# Patient Record
Sex: Male | Born: 1950
Health system: Southern US, Community
[De-identification: ages and names within clinical notes are randomized; demographics above are authoritative.]

## PROBLEM LIST (undated history)

## (undated) DIAGNOSIS — E785 Hyperlipidemia, unspecified: Secondary | ICD-10-CM

## (undated) DIAGNOSIS — T7840XA Allergy, unspecified, initial encounter: Secondary | ICD-10-CM

## (undated) DIAGNOSIS — Z8719 Personal history of other diseases of the digestive system: Secondary | ICD-10-CM

## (undated) DIAGNOSIS — N400 Enlarged prostate without lower urinary tract symptoms: Secondary | ICD-10-CM

## (undated) DIAGNOSIS — Z5189 Encounter for other specified aftercare: Secondary | ICD-10-CM

## (undated) HISTORY — PX: TONSILLECTOMY: SUR1361

## (undated) HISTORY — DX: Encounter for other specified aftercare: Z51.89

## (undated) HISTORY — PX: COLONOSCOPY: SHX174

## (undated) HISTORY — DX: Hyperlipidemia, unspecified: E78.5

## (undated) HISTORY — DX: Personal history of other diseases of the digestive system: Z87.19

## (undated) HISTORY — DX: Allergy, unspecified, initial encounter: T78.40XA

## (undated) HISTORY — DX: Benign prostatic hyperplasia without lower urinary tract symptoms: N40.0

---

## 2007-04-13 ENCOUNTER — Ambulatory Visit: Payer: Self-pay | Admitting: Internal Medicine

## 2007-04-21 ENCOUNTER — Ambulatory Visit: Payer: Self-pay | Admitting: Internal Medicine

## 2013-03-25 ENCOUNTER — Encounter: Payer: Self-pay | Admitting: Family Medicine

## 2013-03-25 ENCOUNTER — Ambulatory Visit (INDEPENDENT_AMBULATORY_CARE_PROVIDER_SITE_OTHER): Payer: BC Managed Care – PPO | Admitting: Family Medicine

## 2013-03-25 ENCOUNTER — Encounter (INDEPENDENT_AMBULATORY_CARE_PROVIDER_SITE_OTHER): Payer: Self-pay

## 2013-03-25 ENCOUNTER — Ambulatory Visit (INDEPENDENT_AMBULATORY_CARE_PROVIDER_SITE_OTHER): Payer: BC Managed Care – PPO

## 2013-03-25 VITALS — BP 116/77 | HR 61 | Temp 98.5°F | Ht 70.0 in | Wt 166.0 lb

## 2013-03-25 DIAGNOSIS — E559 Vitamin D deficiency, unspecified: Secondary | ICD-10-CM

## 2013-03-25 DIAGNOSIS — Z23 Encounter for immunization: Secondary | ICD-10-CM

## 2013-03-25 DIAGNOSIS — N529 Male erectile dysfunction, unspecified: Secondary | ICD-10-CM | POA: Insufficient documentation

## 2013-03-25 DIAGNOSIS — Z8042 Family history of malignant neoplasm of prostate: Secondary | ICD-10-CM | POA: Insufficient documentation

## 2013-03-25 DIAGNOSIS — K644 Residual hemorrhoidal skin tags: Secondary | ICD-10-CM | POA: Insufficient documentation

## 2013-03-25 DIAGNOSIS — K52839 Microscopic colitis, unspecified: Secondary | ICD-10-CM | POA: Insufficient documentation

## 2013-03-25 DIAGNOSIS — E785 Hyperlipidemia, unspecified: Secondary | ICD-10-CM | POA: Insufficient documentation

## 2013-03-25 DIAGNOSIS — Z0000 Encounter for general adult medical examination without abnormal findings: Secondary | ICD-10-CM

## 2013-03-25 DIAGNOSIS — N400 Enlarged prostate without lower urinary tract symptoms: Secondary | ICD-10-CM

## 2013-03-25 DIAGNOSIS — R000 Tachycardia, unspecified: Secondary | ICD-10-CM

## 2013-03-25 NOTE — Progress Notes (Signed)
Subjective:    Patient ID: Matthew Vasquez, male    DOB: 01-Jan-1951, 62 y.o.   MRN: [id number]  HPI Patient here today for annual physical exam. From reviewing the patient's record, he has a past history of hyperlipidemia, hemorrhoids, loose bowel movements, and rectal bleeding. He is last stress test was done in 2000 and this was normal.    There are no active problems to display for this patient.  Outpatient Encounter Prescriptions as of 03/25/2013  Medication Sig  . cholecalciferol (VITAMIN D) 1000 UNITS tablet Take 1,000 Units by mouth daily.    Review of Systems  Constitutional: Negative.   HENT: Negative.   Eyes: Negative.   Respiratory: Positive for cough (minor "tickle cough").   Cardiovascular: Negative.   Gastrointestinal: Negative.   Endocrine: Negative.   Genitourinary: Negative.   Musculoskeletal: Negative.   Skin: Negative.   Allergic/Immunologic: Negative.   Neurological: Negative.   Hematological: Negative.   Psychiatric/Behavioral: Negative.    Patient does complain of some erectile dysfunction and slightly slower voiding than usual.    Objective:   Physical Exam  Nursing note and vitals reviewed. Constitutional: He is oriented to person, place, and time. He appears well-developed and well-nourished.  HENT:  Head: Normocephalic and atraumatic.  Right Ear: External ear normal.  Left Ear: External ear normal.  Nose: Nose normal.  Mouth/Throat: Oropharynx is clear and moist. No oropharyngeal exudate.  Eyes: Conjunctivae and EOM are normal. Pupils are equal, round, and reactive to light. Right eye exhibits no discharge. Left eye exhibits no discharge. No scleral icterus.  Neck: Normal range of motion. Neck supple. No tracheal deviation present. No thyromegaly present.  Cardiovascular: Normal rate, regular rhythm, normal heart sounds and intact distal pulses.  Exam reveals no gallop and no friction rub.   No murmur heard. At 60 per minute  Pulmonary/Chest:  Effort normal and breath sounds normal. No respiratory distress. He has no wheezes. He has no rales. He exhibits no tenderness.  Abdominal: Soft. Bowel sounds are normal. He exhibits no mass. There is no tenderness. There is no rebound and no guarding.  Genitourinary: Rectum normal and penis normal. No penile tenderness.  The prostate was slightly enlarged but smooth. There were no rectal masses. There were abundant external hemorrhoids. The testicles were normal and there was no inguinal hernia. The last PSA in November of 2011 was 1.1  Musculoskeletal: Normal range of motion. He exhibits no edema and no tenderness.  Lymphadenopathy:    He has no cervical adenopathy.  Neurological: He is alert and oriented to person, place, and time. He has normal reflexes. No cranial nerve deficit.  Skin: Skin is warm and dry. No rash noted. No erythema. No pallor.  Psychiatric: He has a normal mood and affect. His behavior is normal. Judgment and thought content normal.   BP 116/77  Pulse 61  Temp(Src) 98.5 F (36.9 C) (Oral)  Ht 5\' 10"  (1.778 m)  Wt 166 lb (75.297 kg)  BMI 23.82 kg/m2  [facility] reading (PRIMARY) by  Dr. Christell Constant; chest x-ray--within normal limits                                EKG: {ekg findings: Bradycardia.       Assessment & Plan:   1. Annual physical exam   2. Vitamin D deficiency   3. BPH (benign prostatic hyperplasia)   4. Hyperlipidemia   5. External hemorrhoids  6. Erectile dysfunction    Orders Placed This Encounter  Procedures  . DG Chest 2 View    Standing Status: Future     Number of Occurrences: 1     Standing Expiration Date: 05/25/2014    Order Specific Question:  Reason for Exam (SYMPTOM  OR DIAGNOSIS REQUIRED)    Answer:  annual exam    Order Specific Question:  Preferred imaging location?    Answer:  Internal  . PSA, total and free    Standing Status: Future     Number of Occurrences:      Standing Expiration Date: 03/25/2014  . NMR, lipoprofile     Standing Status: Future     Number of Occurrences:      Standing Expiration Date: 03/25/2014  . BMP8+EGFR    Standing Status: Future     Number of Occurrences:      Standing Expiration Date: 03/25/2014  . Hepatic function panel    Standing Status: Future     Number of Occurrences:      Standing Expiration Date: 03/25/2014  . Vit D  25 hydroxy (rtn osteoporosis monitoring)    Standing Status: Future     Number of Occurrences:      Standing Expiration Date: 03/25/2014  . POCT CBC    Standing Status: Future     Number of Occurrences:      Standing Expiration Date: 04/24/2013  . POCT UA - Microscopic Only  . POCT urinalysis dipstick  . EKG 12-Lead   Meds ordered this encounter  Medications  . cholecalciferol (VITAMIN D) 1000 UNITS tablet    Sig: Take 1,000 Units by mouth daily.   Patient Instructions  Continue current medications. Continue good therapeutic lifestyle changes which include good diet and exercise. Fall precautions discussed with patient. Schedule your flu vaccine if you haven't had it yet If you are over 78 years old - you may need Prevnar 13 or the adult Pneumonia vaccine. Remember to bring back her FOBT You should get an FOBT and a PSA  Yearly If you get the Prevnar now, he would get a Pneumovax in one year Always drink plenty of water   Samples of Cialis 5 given today for patient to try and to take as needed  Nyra Capes MD

## 2013-03-25 NOTE — Patient Instructions (Addendum)
Continue current medications. Continue good therapeutic lifestyle changes which include good diet and exercise. Fall precautions discussed with patient. Schedule your flu vaccine if you haven't had it yet If you are over 64 years old - you may need Prevnar 13 or the adult Pneumonia vaccine. Remember to bring back her FOBT You should get an FOBT and a PSA  Yearly If you get the Prevnar now, he would get a Pneumovax in one year Always drink plenty of water

## 2013-03-31 ENCOUNTER — Other Ambulatory Visit (INDEPENDENT_AMBULATORY_CARE_PROVIDER_SITE_OTHER): Payer: BC Managed Care – PPO

## 2013-03-31 DIAGNOSIS — Z0000 Encounter for general adult medical examination without abnormal findings: Secondary | ICD-10-CM

## 2013-03-31 DIAGNOSIS — N400 Enlarged prostate without lower urinary tract symptoms: Secondary | ICD-10-CM

## 2013-03-31 DIAGNOSIS — E559 Vitamin D deficiency, unspecified: Secondary | ICD-10-CM

## 2013-03-31 DIAGNOSIS — Z1212 Encounter for screening for malignant neoplasm of rectum: Secondary | ICD-10-CM

## 2013-03-31 LAB — POCT CBC
Granulocyte percent: 64.4 %G (ref 37–80)
Lymph, poc: 1.5 (ref 0.6–3.4)
MCH, POC: 31 pg (ref 27–31.2)
MCHC: 33.1 g/dL (ref 31.8–35.4)
MCV: 93.7 fL (ref 80–97)
MPV: 7.8 fL (ref 0–99.8)
POC LYMPH PERCENT: 30.4 %L (ref 10–50)
Platelet Count, POC: 206 10*3/uL (ref 142–424)
RBC: 4.6 M/uL — AB (ref 4.69–6.13)
RDW, POC: 11.9 %
WBC: 4.9 10*3/uL (ref 4.6–10.2)

## 2013-03-31 NOTE — Addendum Note (Signed)
Addended by: Roselyn Reef on: 03/31/2013 08:48 AM   Modules accepted: Orders

## 2013-03-31 NOTE — Progress Notes (Signed)
Patient came in for labs only.

## 2013-04-01 LAB — BMP8+EGFR
BUN/Creatinine Ratio: 17 (ref 10–22)
Chloride: 102 mmol/L (ref 97–108)
GFR calc Af Amer: 106 mL/min/{1.73_m2} (ref >59)
GFR calc non Af Amer: 92 mL/min/{1.73_m2} (ref >59)
Potassium: 4.8 mmol/L (ref 3.5–5.2)

## 2013-04-01 LAB — FECAL OCCULT BLOOD, IMMUNOCHEMICAL: Fecal Occult Bld: NEGATIVE

## 2013-04-01 LAB — HEPATIC FUNCTION PANEL
AST: 22 IU/L (ref 0–40)
Albumin: 4.2 g/dL (ref 3.6–4.8)
Alkaline Phosphatase: 79 IU/L (ref 39–117)
Bilirubin, Direct: 0.08 mg/dL (ref 0.00–0.40)

## 2013-04-01 LAB — NMR, LIPOPROFILE
HDL Cholesterol by NMR: 54 mg/dL (ref >=40)
HDL Particle Number: 32.1 umol/L (ref >=30.5)
LDL Size: 21.1 nm (ref >20.5)
LDLC SERPL CALC-MCNC: 106 mg/dL — ABNORMAL HIGH (ref <100)
Small LDL Particle Number: 499 nmol/L (ref <=527)
Triglycerides by NMR: 60 mg/dL (ref <150)

## 2013-04-01 LAB — VITAMIN D 25 HYDROXY (VIT D DEFICIENCY, FRACTURES): Vit D, 25-Hydroxy: 47.2 ng/mL (ref 30.0–100.0)

## 2013-04-01 LAB — PSA, TOTAL AND FREE: PSA, Free: 0.35 ng/mL

## 2013-04-14 ENCOUNTER — Telehealth: Payer: Self-pay | Admitting: *Deleted

## 2013-04-14 NOTE — Telephone Encounter (Signed)
Message copied by Baltazar Apo on Wed Apr 14, 2013  9:22 AM ------      Message from: Ernestina Penna      Created: Fri Apr 02, 2013  3:29 PM       The PSA is low and within normal limit      On advanced lipid testing, the total LDL particle number, which is the most important number, is elevated at 1186. December should be less than 1000. The LDL C. is elevated at 106 and this number should be less than 100. The triglycerides are good at 60. The HDL particle number is within normal limits and this is the good cholesterol.------ please try to be more aggressive with managing your diet and doing more exercise, really place an emphasis on for more water, drinking less carbonated beverages, and eating less bread.      Blood sugar renal and electrolytes are all within normal limit      All liver function tests are within normal limit      Vitamin D is good at 47.2 ------

## 2013-04-14 NOTE — Telephone Encounter (Signed)
Pt.notified

## 2013-04-20 ENCOUNTER — Ambulatory Visit (HOSPITAL_COMMUNITY)
Admission: RE | Admit: 2013-04-20 | Discharge: 2013-04-20 | Disposition: A | Discharge status: Home / Self Care | Payer: BC Managed Care – PPO | Source: Ambulatory Visit | Attending: Family Medicine | Admitting: Family Medicine

## 2013-04-20 ENCOUNTER — Encounter: Payer: Self-pay | Admitting: Cardiology

## 2013-04-20 DIAGNOSIS — R000 Tachycardia, unspecified: Secondary | ICD-10-CM

## 2013-04-20 NOTE — Progress Notes (Signed)
Stress Lab Nurses Notes - Matthew Vasquez  Matthew Vasquez 04/20/2013 Reason for doing test: Physical and Tachycardia Type of test: Regular GTX Nurse performing test: Parke Poisson, RN Nuclear Medicine Tech: Not Applicable Echo Tech: Not Applicable MD performing test: S. McDowell/K.Lyman Bishop NP Family MD: Dr. Christell Constant Test explained and consent signed: yes IV started: No IV started Symptoms: None Treatment/Intervention: None Reason test stopped: reached target HR After recovery IV was: NA Patient to return to Nuc. Med at : NA Patient discharged: Home Patient's Condition upon discharge was: stable Comments: During test peak BP 174/78 & HR 156.  Recovery BP 119/83 & HR 86.  Symptoms resolved in recovery. Erskine Speed T

## 2013-04-20 NOTE — Progress Notes (Signed)
Stress Lab Nurses Notes - Matthew Vasquez   Matthew Vasquez  04/20/2013  Reason for doing test: Physical and Tachycardia  Type of test: Regular GTX  Nurse performing test: Parke Poisson, RN  MD performing test: S. McDowell/K.Lyman Bishop NP  Family MD: Dr. Christell Constant  Test explained and consent signed: Yes  IV started: No IV started  Symptoms: None  Treatment/Intervention: None  Reason test stopped: Reached target HR  After recovery IV was: NA  Patient discharged: Home  Patient's Condition upon discharge was: Stable  Comments: During test peak BP 174/78 & HR 156. Recovery BP 119/83 & HR 86. Symptoms resolved in recovery.  Matthew Vasquez  Attending note:  Patient exercised on Bruce protocol for 10:29 reaching workload 13.4 METS. No chest pain reported. Peak heart rate 157 BPM, 100% MPHR. Peak blood pressure 174/78. No consistent abnormal ST segment depression noted at stress. In recovery there was equivocal (0.5-1 mm) ST segment depression in leads II, III, aVF, to lesser degree V5-V6 without reported symptoms. Duke treadmill score is 5.5 = low risk study overall.  Matthew Vasquez, M.D., F.A.C.C.

## 2013-06-30 ENCOUNTER — Ambulatory Visit (HOSPITAL_COMMUNITY)
Admission: RE | Admit: 2013-06-30 | Discharge: 2013-06-30 | Disposition: A | Discharge status: Home / Self Care | Payer: BC Managed Care – PPO | Source: Ambulatory Visit | Attending: Family Medicine | Admitting: Family Medicine

## 2013-06-30 ENCOUNTER — Ambulatory Visit (INDEPENDENT_AMBULATORY_CARE_PROVIDER_SITE_OTHER): Payer: BC Managed Care – PPO | Admitting: Family Medicine

## 2013-06-30 ENCOUNTER — Telehealth: Payer: Self-pay | Admitting: Family Medicine

## 2013-06-30 ENCOUNTER — Encounter: Payer: Self-pay | Admitting: Family Medicine

## 2013-06-30 VITALS — BP 131/76 | HR 57 | Temp 97.2°F | Ht 71.0 in | Wt 168.0 lb

## 2013-06-30 DIAGNOSIS — R42 Dizziness and giddiness: Secondary | ICD-10-CM

## 2013-06-30 DIAGNOSIS — W19XXXA Unspecified fall, initial encounter: Secondary | ICD-10-CM | POA: Insufficient documentation

## 2013-06-30 MED ORDER — MECLIZINE HCL 25 MG PO TABS: 25.0000 mg | ORAL_TABLET | Freq: Three times a day (TID) | ORAL | Status: DC | PRN

## 2013-06-30 NOTE — Progress Notes (Signed)
   Subjective:    Patient ID: Matthew Vasquez, male    DOB: 1950/09/27, 63 y.o.   MRN: [id number]  HPI Patient presents today with chief complaint of dizziness. Has had some positional dizziness over the past 3-4 days. Has had sensation of room spinning go from sitting to standing. No chest pain or shortness of breath prior to onset. Patient reports that he was skiing last weekend and did fall hitting his head. The patient was wearing a helmet at the time. Denies any headaches. Denies any hemiparesis or confusion. No significant vision changes. No hearing loss or ear ringing. No recent infections. Does report having similar episodes of dizziness in the past after hitting his head.  No prior history of stroke, heart disease, diabetes.   Review of Systems  All other systems reviewed and are negative.       Objective:   Physical Exam  Constitutional: He appears well-developed and well-nourished.  HENT:  Head: Normocephalic and atraumatic.  Right Ear: External ear normal.  Left Ear: External ear normal.  Eyes: Conjunctivae are normal. Pupils are equal, round, and reactive to light.  Neck: Normal range of motion. Neck supple.  Cardiovascular: Normal rate and regular rhythm.   Pulmonary/Chest: Effort normal and breath sounds normal.  Abdominal: Soft. Bowel sounds are normal.  Musculoskeletal: Normal range of motion.  Dix-Hallpike mildly positive Mild bilateral nystagmus.  Neurological: He is alert. He displays normal reflexes. No cranial nerve deficit. Coordination normal.  Skin: Skin is warm.          Assessment & Plan:  Dizziness - Plan: CT Head Wo Contrast, meclizine (ANTIVERT) 25 MG tablet  Fall - Plan: CT Head Wo Contrast  Differential diagnoses for symptoms for a broad though I suspect this may be mild postconcussive syndrome versus vertigo. No focal neurological deficits on exam. Given traumatic etiology of symptoms, we'll obtain a head CT without contrast to  further assess for any subdural hematomas or intracranial abnormalities though this is less likely. We'll place additional course of meclizine for symptomatic treatment in the interim. Discussed neuro red flags at length. Follow up as needed

## 2013-06-30 NOTE — Telephone Encounter (Signed)
Patient aware.

## 2014-03-22 ENCOUNTER — Ambulatory Visit (INDEPENDENT_AMBULATORY_CARE_PROVIDER_SITE_OTHER): Payer: Medicare Other

## 2014-03-22 DIAGNOSIS — Z23 Encounter for immunization: Secondary | ICD-10-CM

## 2014-09-20 ENCOUNTER — Other Ambulatory Visit (INDEPENDENT_AMBULATORY_CARE_PROVIDER_SITE_OTHER): Payer: Medicare Other

## 2014-09-20 DIAGNOSIS — E559 Vitamin D deficiency, unspecified: Secondary | ICD-10-CM

## 2014-09-20 DIAGNOSIS — E785 Hyperlipidemia, unspecified: Secondary | ICD-10-CM

## 2014-09-20 DIAGNOSIS — N400 Enlarged prostate without lower urinary tract symptoms: Secondary | ICD-10-CM | POA: Diagnosis not present

## 2014-09-20 DIAGNOSIS — Z0000 Encounter for general adult medical examination without abnormal findings: Secondary | ICD-10-CM

## 2014-09-20 LAB — POCT CBC
Granulocyte percent: 62.2 %G (ref 37–80)
HCT, POC: 46.2 % (ref 43.5–53.7)
Hemoglobin: 14.2 g/dL (ref 14.1–18.1)
Lymph, poc: 2.1 (ref 0.6–3.4)
MCH: 29 pg (ref 27–31.2)
MCHC: 30.8 g/dL — AB (ref 31.8–35.4)
MCV: 94.3 fL (ref 80–97)
MPV: 8.2 fL (ref 0–99.8)
POC GRANULOCYTE: 4.2 (ref 2–6.9)
POC LYMPH PERCENT: 31.4 %L (ref 10–50)
Platelet Count, POC: 266 10*3/uL (ref 142–424)
RBC: 4.9 M/uL (ref 4.69–6.13)
RDW, POC: 12 %
WBC: 6.8 10*3/uL (ref 4.6–10.2)

## 2014-09-20 NOTE — Progress Notes (Signed)
Lab only 

## 2014-09-21 LAB — BMP8+EGFR
BUN/Creatinine Ratio: 25 — ABNORMAL HIGH (ref 10–22)
BUN: 19 mg/dL (ref 8–27)
CHLORIDE: 101 mmol/L (ref 97–108)
CO2: 24 mmol/L (ref 18–29)
Calcium: 8.8 mg/dL (ref 8.6–10.2)
Creatinine, Ser: 0.76 mg/dL (ref 0.76–1.27)
GFR calc non Af Amer: 96 mL/min/{1.73_m2} (ref >59)
GFR, EST AFRICAN AMERICAN: 111 mL/min/{1.73_m2} (ref >59)
GLUCOSE: 97 mg/dL (ref 65–99)
POTASSIUM: 4.4 mmol/L (ref 3.5–5.2)
SODIUM: 139 mmol/L (ref 134–144)

## 2014-09-21 LAB — THYROID PANEL WITH TSH
FREE THYROXINE INDEX: 1.9 (ref 1.2–4.9)
T3 UPTAKE RATIO: 29 % (ref 24–39)
T4 TOTAL: 6.4 ug/dL (ref 4.5–12.0)
TSH: 3.22 u[IU]/mL (ref 0.450–4.500)

## 2014-09-21 LAB — VITAMIN D 25 HYDROXY (VIT D DEFICIENCY, FRACTURES): Vit D, 25-Hydroxy: 39.4 ng/mL (ref 30.0–100.0)

## 2014-09-21 LAB — LIPID PANEL
Chol/HDL Ratio: 3.3 ratio units (ref 0.0–5.0)
Cholesterol, Total: 190 mg/dL (ref 100–199)
HDL: 57 mg/dL (ref >39)
LDL Calculated: 114 mg/dL — ABNORMAL HIGH (ref 0–99)
Triglycerides: 95 mg/dL (ref 0–149)
VLDL Cholesterol Cal: 19 mg/dL (ref 5–40)

## 2014-09-21 LAB — HEPATIC FUNCTION PANEL
ALBUMIN: 4.3 g/dL (ref 3.6–4.8)
ALK PHOS: 84 IU/L (ref 39–117)
ALT: 20 IU/L (ref 0–44)
AST: 20 IU/L (ref 0–40)
Bilirubin Total: 0.3 mg/dL (ref 0.0–1.2)
Bilirubin, Direct: 0.08 mg/dL (ref 0.00–0.40)
TOTAL PROTEIN: 6.9 g/dL (ref 6.0–8.5)

## 2014-09-21 LAB — PSA, TOTAL AND FREE
PSA, Free Pct: 19.3 %
PSA, Free: 0.27 ng/mL
Prostate Specific Ag, Serum: 1.4 ng/mL (ref 0.0–4.0)

## 2014-09-27 ENCOUNTER — Ambulatory Visit (INDEPENDENT_AMBULATORY_CARE_PROVIDER_SITE_OTHER): Payer: Medicare Other | Admitting: Family Medicine

## 2014-09-27 ENCOUNTER — Ambulatory Visit (INDEPENDENT_AMBULATORY_CARE_PROVIDER_SITE_OTHER): Payer: Medicare Other

## 2014-09-27 ENCOUNTER — Other Ambulatory Visit: Payer: Self-pay | Admitting: Family Medicine

## 2014-09-27 ENCOUNTER — Encounter: Payer: Self-pay | Admitting: Family Medicine

## 2014-09-27 VITALS — BP 118/74 | HR 65 | Temp 97.4°F | Ht 71.0 in | Wt 170.0 lb

## 2014-09-27 DIAGNOSIS — Z23 Encounter for immunization: Secondary | ICD-10-CM | POA: Diagnosis not present

## 2014-09-27 DIAGNOSIS — Z8042 Family history of malignant neoplasm of prostate: Secondary | ICD-10-CM | POA: Diagnosis not present

## 2014-09-27 DIAGNOSIS — Z0000 Encounter for general adult medical examination without abnormal findings: Secondary | ICD-10-CM

## 2014-09-27 DIAGNOSIS — K640 First degree hemorrhoids: Secondary | ICD-10-CM | POA: Diagnosis not present

## 2014-09-27 DIAGNOSIS — N5201 Erectile dysfunction due to arterial insufficiency: Secondary | ICD-10-CM

## 2014-09-27 DIAGNOSIS — E559 Vitamin D deficiency, unspecified: Secondary | ICD-10-CM

## 2014-09-27 DIAGNOSIS — N400 Enlarged prostate without lower urinary tract symptoms: Secondary | ICD-10-CM

## 2014-09-27 DIAGNOSIS — E785 Hyperlipidemia, unspecified: Secondary | ICD-10-CM

## 2014-09-27 DIAGNOSIS — R0789 Other chest pain: Secondary | ICD-10-CM

## 2014-09-27 LAB — POCT URINALYSIS DIPSTICK
Bilirubin, UA: NEGATIVE
Blood, UA: NEGATIVE
Glucose, UA: NEGATIVE
KETONES UA: NEGATIVE
Nitrite, UA: NEGATIVE
PH UA: 7.5
PROTEIN UA: NEGATIVE
SPEC GRAV UA: 1.01
UROBILINOGEN UA: NEGATIVE

## 2014-09-27 LAB — POCT UA - MICROSCOPIC ONLY
BACTERIA, U MICROSCOPIC: NEGATIVE
CRYSTALS, UR, HPF, POC: NEGATIVE
Casts, Ur, LPF, POC: NEGATIVE
MUCUS UA: NEGATIVE
RBC, URINE, MICROSCOPIC: NEGATIVE
Yeast, UA: NEGATIVE

## 2014-09-27 NOTE — Progress Notes (Signed)
Subjective:    Patient ID: Matthew Vasquez, male    DOB: 20-Aug-1950, 64 y.o.   MRN: [id number]  HPI Pt here for follow up and management of chronic medical problems which includes hyperlipidemia and BPH. He is taking OTC meds regularly.       Patient Active Problem List   Diagnosis Date Noted  . BPH (benign prostatic hyperplasia) 03/25/2013  . Hyperlipidemia 03/25/2013  . External hemorrhoids 03/25/2013  . Erectile dysfunction 03/25/2013  . Microscopic colitis, with secondary loose stools controlled comfortably with Imodium 03/25/2013  . FHx: prostate cancer 03/25/2013   Outpatient Encounter Prescriptions as of 09/27/2014  Medication Sig  . aspirin 81 MG tablet Take 81 mg by mouth daily.  . cholecalciferol (VITAMIN D) 1000 UNITS tablet Take 1,000 Units by mouth daily.  . [DISCONTINUED] meclizine (ANTIVERT) 25 MG tablet Take 1 tablet (25 mg total) by mouth 3 (three) times daily as needed for dizziness.   No facility-administered encounter medications on file as of 09/27/2014.      Review of Systems  Constitutional: Negative.   HENT: Negative.   Eyes: Negative.   Respiratory: Positive for chest tightness (at times).   Cardiovascular: Negative.   Gastrointestinal: Negative.   Endocrine: Negative.   Genitourinary: Negative.   Musculoskeletal: Negative.   Skin: Negative.   Allergic/Immunologic: Negative.   Neurological: Negative.   Hematological: Negative.   Psychiatric/Behavioral: Negative.        Objective:   Physical Exam  Constitutional: He is oriented to person, place, and time. He appears well-developed and well-nourished. No distress.  Pleasant and alert and healthy appearing  HENT:  Head: Normocephalic and atraumatic.  Right Ear: External ear normal.  Left Ear: External ear normal.  Nose: Nose normal.  Mouth/Throat: Oropharynx is clear and moist. No oropharyngeal exudate.  Eyes: Conjunctivae and EOM are normal. Pupils are equal, round, and reactive to  light. Right eye exhibits no discharge. Left eye exhibits no discharge. No scleral icterus.  Neck: Normal range of motion. Neck supple. No thyromegaly present.  No carotid bruits or anterior cervical adenopathy  Cardiovascular: Normal rate, regular rhythm, normal heart sounds and intact distal pulses.   No murmur heard. At 72/m with a regular rate and rhythm  Pulmonary/Chest: Effort normal and breath sounds normal. No respiratory distress. He has no wheezes. He has no rales. He exhibits no tenderness.  Lungs are clear anteriorly and posteriorly No axillary adenopathy  Abdominal: Soft. Bowel sounds are normal. He exhibits no mass. There is no tenderness. There is no rebound and no guarding.  Abdomen was nontender without masses or organ enlargement.  Genitourinary: Rectum normal and penis normal.  There were external hemorrhoids present. The rectum was normal without masses the prostate was enlarged but soft and smooth without lumps or masses. There were no inguinal hernias palpable and no inguinal nodes. The external genitalia were otherwise within normal limits.  Musculoskeletal: Normal range of motion. He exhibits no edema or tenderness.  Lymphadenopathy:    He has no cervical adenopathy.  Neurological: He is alert and oriented to person, place, and time. He has normal reflexes. No cranial nerve deficit.  Skin: Skin is warm and dry. No rash noted. No erythema. No pallor.  Psychiatric: He has a normal mood and affect. His behavior is normal. Judgment and thought content normal.  Nursing note and vitals reviewed.  BP 118/74 mmHg  Pulse 65  Temp(Src) 97.4 F (36.3 C) (Oral)  Ht 5\' 11"  (1.803 m)  Wt 170  lb (77.111 kg)  BMI 23.72 kg/m2  EKG: Sinus bradycardia otherwise within normal limits.  [facility] reading (PRIMARY) by  Dr. Brunilda Payor x-ray --no active disease                                       Assessment & Plan:  1. Vitamin D deficiency -The patient will increase his vitamin D3  to 2000 units daily  2. Hyperlipidemia -He has not been exercising as much and he is going to try to do better with his exercise and hopefully we can repeat the lipid panel get the LDL C Damm low 103-4 months - EKG 12-Lead  3. BPH (benign prostatic hyperplasia) -The prostate was enlarged but he is having no particular issues with this. - POCT urinalysis dipstick - POCT UA - Microscopic Only  4. FHx: prostate cancer -The patient's PSA was stable and within normal limits.  5. Chest tightness -The EKG had no significant abnormality other than bradycardia and he had a stress test less than 2 years ago that was normal. - EKG 12-Lead  6. Erectile dysfunction due to arterial insufficiency -The patient uses Cialis for this and it has worked well for him. -He will try some samples of Viagra 50 mg  7. First degree hemorrhoids -These appear stable and they're not giving him any problems.  Meds ordered this encounter  Medications  . aspirin 81 MG tablet    Sig: Take 81 mg by mouth daily.   Patient Instructions  Stay active and drink plenty of fluids this summer Consider recheck in your lipid panel in 3-4 months is to see if you can bring your LDL cholesterol down some more with increase physical activity exercise and diet If the tightness that you've been having in your chest because more frequent or you can associate it with anything specifically please get back in touch with [place], otherwise we will schedule you for a stress test sometime this fall with one of our new doctors when they join the practice. Watch the fried foods caffeine and alcohol in your diet as this may play a role with some GI irritation and could cause some problems with her stomach also and with your chest Excessive dairy products can also cause pressure from the abdomen up into the chest so try to associate any symptoms she had with what you have just done or eaten Return the FOBT The Prevnar vaccine at [facility] today may  make your arm sore   Arrie Senate MD

## 2014-09-27 NOTE — Patient Instructions (Signed)
Stay active and drink plenty of fluids this summer Consider recheck in your lipid panel in 3-4 months is to see if you can bring your LDL cholesterol down some more with increase physical activity exercise and diet If the tightness that you've been having in your chest because more frequent or you can associate it with anything specifically please get back in touch with [place], otherwise we will schedule you for a stress test sometime this fall with one of our new doctors when they join the practice. Watch the fried foods caffeine and alcohol in your diet as this may play a role with some GI irritation and could cause some problems with her stomach also and with your chest Excessive dairy products can also cause pressure from the abdomen up into the chest so try to associate any symptoms she had with what you have just done or eaten Return the FOBT The Prevnar vaccine at [facility] today may make your arm sore

## 2014-10-18 ENCOUNTER — Other Ambulatory Visit: Payer: Medicare Other

## 2014-10-18 DIAGNOSIS — Z1212 Encounter for screening for malignant neoplasm of rectum: Secondary | ICD-10-CM

## 2014-10-18 NOTE — Progress Notes (Signed)
Lab only 

## 2014-10-19 LAB — FECAL OCCULT BLOOD, IMMUNOCHEMICAL: Fecal Occult Bld: NEGATIVE

## 2014-12-01 ENCOUNTER — Telehealth: Payer: Self-pay | Admitting: Family Medicine

## 2014-12-01 MED ORDER — SILDENAFIL CITRATE 20 MG PO TABS: ORAL_TABLET | ORAL | Status: DC

## 2014-12-01 NOTE — Telephone Encounter (Signed)
PT AWARE, MED SENT IN

## 2015-02-23 ENCOUNTER — Ambulatory Visit (INDEPENDENT_AMBULATORY_CARE_PROVIDER_SITE_OTHER): Payer: Medicare Other

## 2015-02-23 DIAGNOSIS — Z23 Encounter for immunization: Secondary | ICD-10-CM

## 2015-06-13 ENCOUNTER — Telehealth: Payer: Self-pay | Admitting: Family Medicine

## 2015-06-13 MED ORDER — SILDENAFIL CITRATE 50 MG PO TABS: 50.0000 mg | ORAL_TABLET | Freq: Every day | ORAL | Status: DC | PRN

## 2015-06-13 NOTE — Telephone Encounter (Signed)
Patient called wanting a refill on Sildenafil.  Originally prescribed 20mg .  Patient states that he is taking 2.5 tablets.  And would like to have an Rx for 50 mg. Sent to the drug store in [place]

## 2015-06-13 NOTE — Telephone Encounter (Signed)
This is okay to refill 

## 2015-06-13 NOTE — Telephone Encounter (Signed)
Patient aware that new rx.

## 2015-08-08 ENCOUNTER — Telehealth: Payer: Self-pay | Admitting: Family Medicine

## 2015-08-08 MED ORDER — OSELTAMIVIR PHOSPHATE 75 MG PO CAPS: 75.0000 mg | ORAL_CAPSULE | Freq: Every day | ORAL | Status: DC

## 2015-08-08 NOTE — Telephone Encounter (Signed)
Per [facility] -  treatment sent in  - daily for 10 days

## 2016-02-22 DIAGNOSIS — Z23 Encounter for immunization: Secondary | ICD-10-CM | POA: Diagnosis not present

## 2016-11-04 ENCOUNTER — Other Ambulatory Visit: Payer: Medicare Other

## 2016-11-04 DIAGNOSIS — Z0000 Encounter for general adult medical examination without abnormal findings: Secondary | ICD-10-CM

## 2016-11-04 DIAGNOSIS — E7800 Pure hypercholesterolemia, unspecified: Secondary | ICD-10-CM | POA: Diagnosis not present

## 2016-11-04 DIAGNOSIS — N400 Enlarged prostate without lower urinary tract symptoms: Secondary | ICD-10-CM | POA: Diagnosis not present

## 2016-11-05 LAB — LIPID PANEL
CHOL/HDL RATIO: 2.8 ratio (ref 0.0–5.0)
Cholesterol, Total: 159 mg/dL (ref 100–199)
HDL: 56 mg/dL (ref >39)
LDL CALC: 93 mg/dL (ref 0–99)
Triglycerides: 52 mg/dL (ref 0–149)
VLDL CHOLESTEROL CAL: 10 mg/dL (ref 5–40)

## 2016-11-05 LAB — CBC WITH DIFFERENTIAL/PLATELET
Basophils Absolute: 0 10*3/uL (ref 0.0–0.2)
Basos: 1 %
EOS (ABSOLUTE): 0.2 10*3/uL (ref 0.0–0.4)
Eos: 3 %
Hematocrit: 41.2 % (ref 37.5–51.0)
Hemoglobin: 14 g/dL (ref 13.0–17.7)
IMMATURE GRANS (ABS): 0 10*3/uL (ref 0.0–0.1)
IMMATURE GRANULOCYTES: 0 %
LYMPHS: 28 %
Lymphocytes Absolute: 1.4 10*3/uL (ref 0.7–3.1)
MCH: 31.6 pg (ref 26.6–33.0)
MCHC: 34 g/dL (ref 31.5–35.7)
MCV: 93 fL (ref 79–97)
Monocytes Absolute: 0.4 10*3/uL (ref 0.1–0.9)
Monocytes: 8 %
NEUTROS PCT: 60 %
Neutrophils Absolute: 3 10*3/uL (ref 1.4–7.0)
Platelets: 235 10*3/uL (ref 150–379)
RBC: 4.43 x10E6/uL (ref 4.14–5.80)
RDW: 12.9 % (ref 12.3–15.4)
WBC: 5 10*3/uL (ref 3.4–10.8)

## 2016-11-05 LAB — HEPATIC FUNCTION PANEL
ALT: 22 IU/L (ref 0–44)
AST: 24 IU/L (ref 0–40)
Albumin: 4.2 g/dL (ref 3.6–4.8)
Alkaline Phosphatase: 72 IU/L (ref 39–117)
BILIRUBIN, DIRECT: 0.13 mg/dL (ref 0.00–0.40)
Bilirubin Total: 0.5 mg/dL (ref 0.0–1.2)
TOTAL PROTEIN: 6.9 g/dL (ref 6.0–8.5)

## 2016-11-05 LAB — BMP8+EGFR
BUN/Creatinine Ratio: 19 (ref 10–24)
BUN: 17 mg/dL (ref 8–27)
CALCIUM: 9 mg/dL (ref 8.6–10.2)
CHLORIDE: 103 mmol/L (ref 96–106)
CO2: 25 mmol/L (ref 20–29)
Creatinine, Ser: 0.88 mg/dL (ref 0.76–1.27)
GFR calc Af Amer: 103 mL/min/{1.73_m2} (ref >59)
GFR calc non Af Amer: 89 mL/min/{1.73_m2} (ref >59)
Glucose: 91 mg/dL (ref 65–99)
Potassium: 4.3 mmol/L (ref 3.5–5.2)
Sodium: 140 mmol/L (ref 134–144)

## 2016-11-05 LAB — PSA, TOTAL AND FREE
PSA FREE: 0.37 ng/mL
PSA, Free Pct: 23.1 %
Prostate Specific Ag, Serum: 1.6 ng/mL (ref 0.0–4.0)

## 2016-11-05 LAB — THYROID PANEL WITH TSH
FREE THYROXINE INDEX: 1.4 (ref 1.2–4.9)
T3 Uptake Ratio: 25 % (ref 24–39)
T4, Total: 5.7 ug/dL (ref 4.5–12.0)
TSH: 3.35 u[IU]/mL (ref 0.450–4.500)

## 2016-11-05 LAB — VITAMIN D 25 HYDROXY (VIT D DEFICIENCY, FRACTURES): VIT D 25 HYDROXY: 61.1 ng/mL (ref 30.0–100.0)

## 2016-11-07 ENCOUNTER — Ambulatory Visit (INDEPENDENT_AMBULATORY_CARE_PROVIDER_SITE_OTHER): Payer: Medicare Other

## 2016-11-07 ENCOUNTER — Encounter: Payer: Self-pay | Admitting: Family Medicine

## 2016-11-07 ENCOUNTER — Other Ambulatory Visit: Payer: Self-pay | Admitting: Family Medicine

## 2016-11-07 ENCOUNTER — Ambulatory Visit (INDEPENDENT_AMBULATORY_CARE_PROVIDER_SITE_OTHER): Payer: Medicare Other | Admitting: Family Medicine

## 2016-11-07 VITALS — BP 122/76 | HR 61 | Temp 97.7°F | Ht 71.0 in | Wt 157.0 lb

## 2016-11-07 DIAGNOSIS — Z23 Encounter for immunization: Secondary | ICD-10-CM | POA: Diagnosis not present

## 2016-11-07 DIAGNOSIS — E7800 Pure hypercholesterolemia, unspecified: Secondary | ICD-10-CM | POA: Diagnosis not present

## 2016-11-07 DIAGNOSIS — Z0000 Encounter for general adult medical examination without abnormal findings: Secondary | ICD-10-CM | POA: Diagnosis not present

## 2016-11-07 DIAGNOSIS — E559 Vitamin D deficiency, unspecified: Secondary | ICD-10-CM

## 2016-11-07 DIAGNOSIS — Z8249 Family history of ischemic heart disease and other diseases of the circulatory system: Secondary | ICD-10-CM

## 2016-11-07 DIAGNOSIS — N400 Enlarged prostate without lower urinary tract symptoms: Secondary | ICD-10-CM

## 2016-11-07 DIAGNOSIS — N5201 Erectile dysfunction due to arterial insufficiency: Secondary | ICD-10-CM

## 2016-11-07 DIAGNOSIS — K644 Residual hemorrhoidal skin tags: Secondary | ICD-10-CM

## 2016-11-07 LAB — URINALYSIS, COMPLETE
Bilirubin, UA: NEGATIVE
GLUCOSE, UA: NEGATIVE
KETONES UA: NEGATIVE
Leukocytes, UA: NEGATIVE
Nitrite, UA: NEGATIVE
PROTEIN UA: NEGATIVE
RBC, UA: NEGATIVE
SPEC GRAV UA: 1.02 (ref 1.005–1.030)
Urobilinogen, Ur: 0.2 mg/dL (ref 0.2–1.0)
pH, UA: 6.5 (ref 5.0–7.5)

## 2016-11-07 LAB — MICROSCOPIC EXAMINATION
Bacteria, UA: NONE SEEN
Epithelial Cells (non renal): NONE SEEN /hpf (ref 0–10)
RBC, UA: NONE SEEN /hpf (ref 0–?)
Renal Epithel, UA: NONE SEEN /hpf
WBC, UA: NONE SEEN /hpf (ref 0–?)

## 2016-11-07 IMAGING — CR DG CHEST 2V
2 series · 2 of 2 positions shown · non-contrast
Comparison: 03/25/2013

CLINICAL DATA: Routine physical examination.

EXAM:
CHEST - 2 VIEW

[view not recorded (1 of 2)]
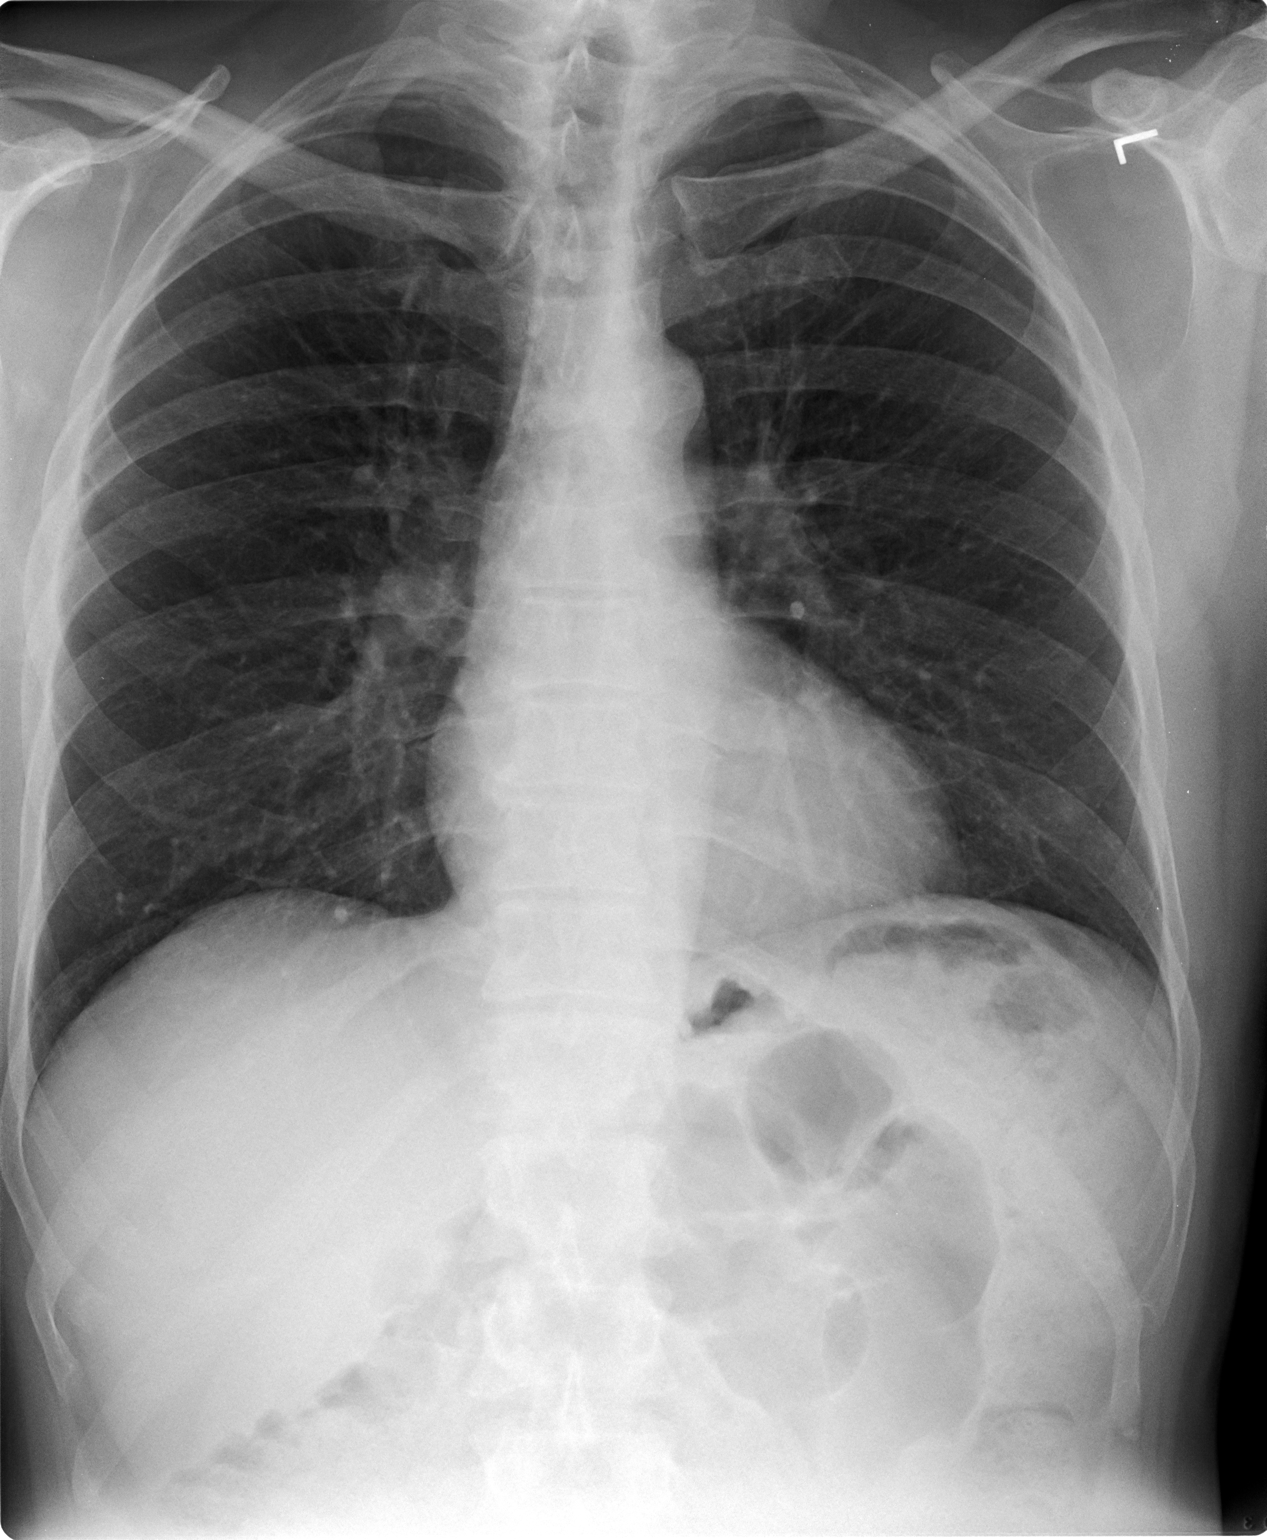

[view not recorded (2 of 2)]
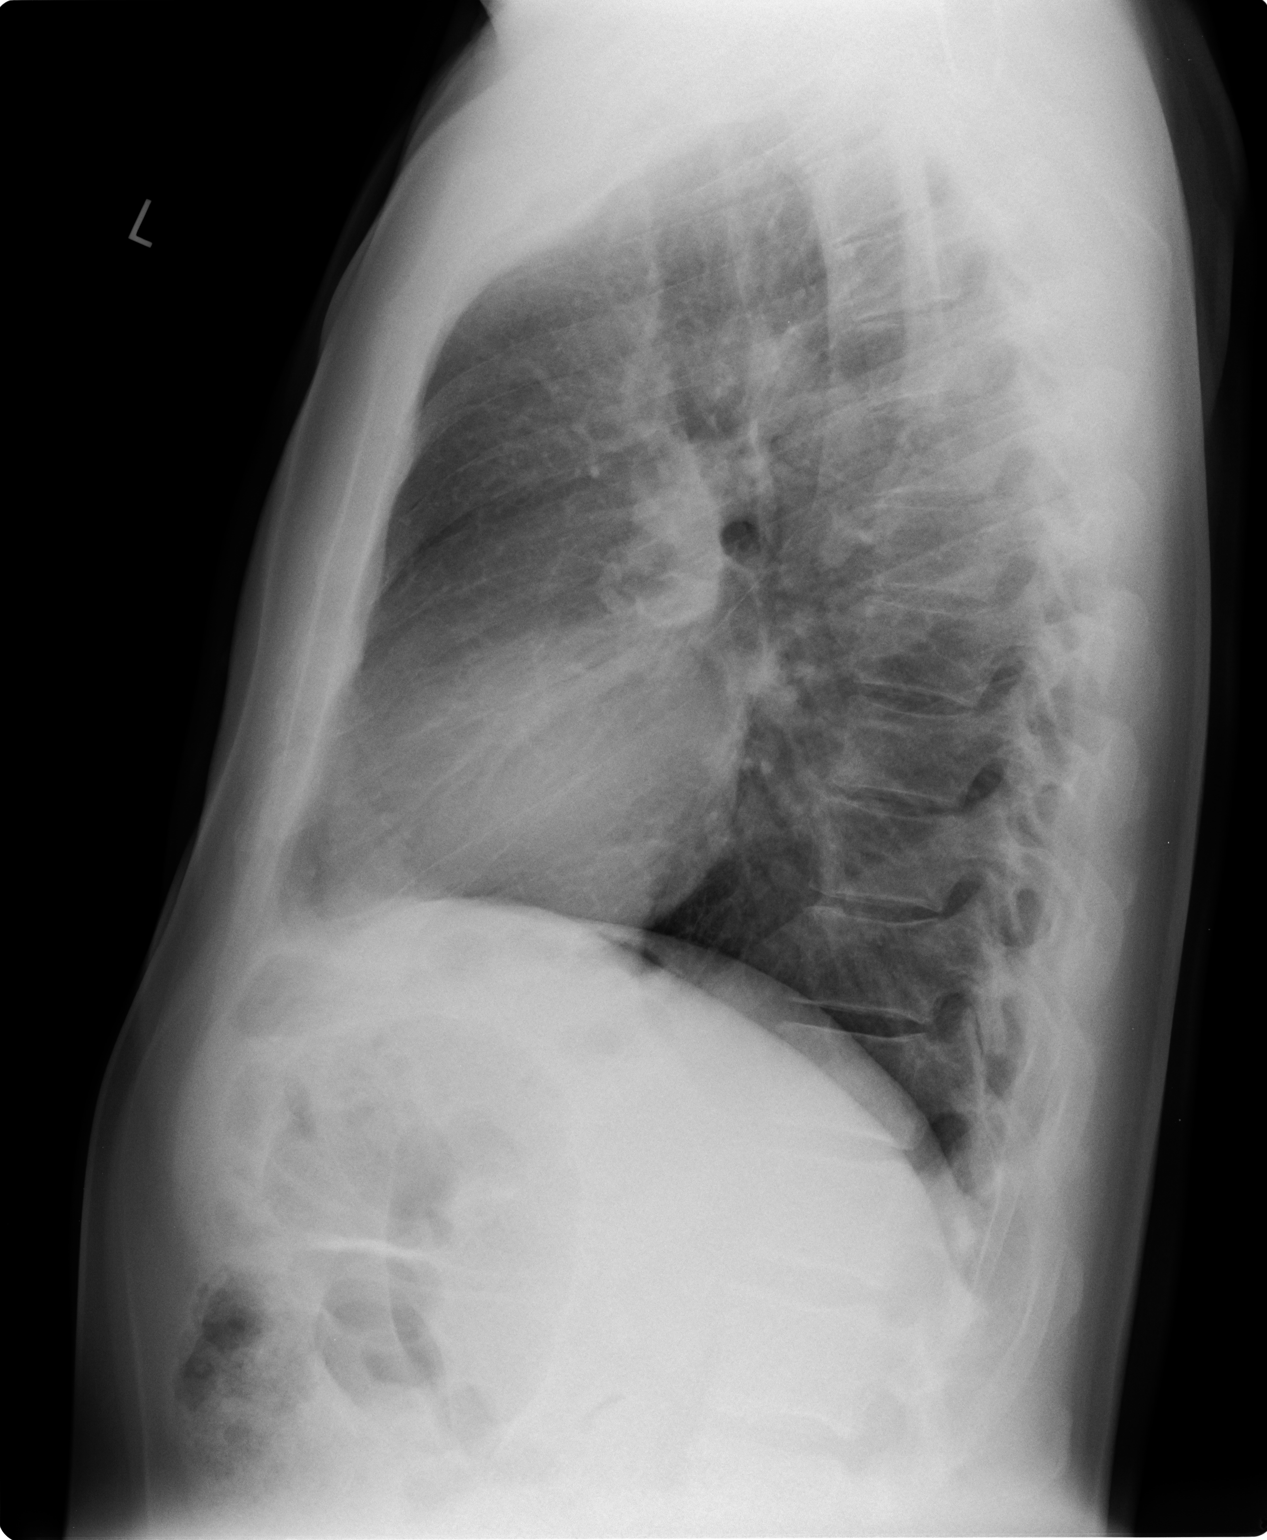

[2 of 2 positions shown; findings below may reference images not displayed]

FINDINGS: The heart size and mediastinal contours are within normal limits.
There is no evidence of pulmonary edema, consolidation,
pneumothorax, nodule or pleural fluid. The visualized skeletal
structures are unremarkable.
IMPRESSION: No active disease.

## 2016-11-07 NOTE — Progress Notes (Signed)
Subjective:    Patient ID: Matthew Vasquez, male    DOB: January 24, 1951, 66 y.o.   MRN: [id number]  HPI Pt here for follow up and management of chronic medical problems which includes vit d def and BPH. He is taking medication regularly.The patient is doing well today with no particular complaints. He is requesting refills on his vitamin D and sildenafil. He is had blood work done and this will be reviewed with him during the visit today. His PSA was good at 1.6. His blood sugar renal function and electrolytes were all normal. The CBC had a normal white blood cell count. The hemoglobin was excellent at 14.0 and platelet count was adequate. All liver function tests were normal. All thyroid function tests were normal. Cholesterol numbers with traditional lipid testing have an LDL C cholesterol that was good and at goal at 93. The HDL or good cholesterol was good at 56 and triglycerides were excellent at 52. The patient a she'll be given a copy of these results. He has had his Prevnar vaccine and will get the Pneumovax today. The patient's father died at about 54 years of age of prostate cancer that metastasized to his bones. His mom had heart disease and died at 65. His sister has heart disease and is still living and is closest sister had CA history of a brain tumor with seizure activity now but doing well. The patient will receive a Pneumovax today. He will check with his insurance regarding the new shingles shot and we will put a reminder of this and the patient instructions noted. He will get a chest x-ray today. He does describe some occasional discomfort in his chest that goes away with taking some deep breaths. He is active physically and does not have this discomfort with physical activity. He denies any outright chest pain or shortness of breath. He denies any trouble with his intestinal tract including nausea vomiting diarrhea blood in the stool or black tarry bowel movements. He is not certain when he  had his last colonoscopy and we will look into this and make sure that he gets schedule for one at the appropriate time. Seeing his water without problems.    Patient Active Problem List   Diagnosis Date Noted  . BPH (benign prostatic hyperplasia) 03/25/2013  . Hyperlipidemia 03/25/2013  . External hemorrhoids 03/25/2013  . Erectile dysfunction 03/25/2013  . Microscopic colitis, with secondary loose stools controlled comfortably with Imodium 03/25/2013  . FHx: prostate cancer 03/25/2013   Outpatient Encounter Prescriptions as of 11/07/2016  Medication Sig  . aspirin 81 MG tablet Take 81 mg by mouth daily.  . cholecalciferol (VITAMIN D) 1000 UNITS tablet Take 1,000 Units by mouth daily.  . sildenafil (REVATIO) 20 MG tablet Take 2-5 tabs as needs prior to sexual activity.  . [DISCONTINUED] oseltamivir (TAMIFLU) 75 MG capsule Take 1 capsule (75 mg total) by mouth daily.  . [DISCONTINUED] sildenafil (VIAGRA) 50 MG tablet Take 1 tablet (50 mg total) by mouth daily as needed for erectile dysfunction.   No facility-administered encounter medications on file as of 11/07/2016.      Review of Systems  Constitutional: Negative.   HENT: Negative.   Eyes: Negative.   Respiratory: Negative.   Cardiovascular: Negative.   Gastrointestinal: Negative.   Endocrine: Negative.   Genitourinary: Negative.   Musculoskeletal: Negative.   Skin: Negative.   Allergic/Immunologic: Negative.   Neurological: Negative.   Hematological: Negative.   Psychiatric/Behavioral: Negative.  Objective:   Physical Exam  Constitutional: He is oriented to person, place, and time. He appears well-developed and well-nourished. No distress.  The patient is pleasant and alert  HENT:  Head: Normocephalic and atraumatic.  Right Ear: External ear normal.  Left Ear: External ear normal.  Nose: Nose normal.  Mouth/Throat: Oropharynx is clear and moist. No oropharyngeal exudate.  Eyes: Conjunctivae and EOM are  normal. Pupils are equal, round, and reactive to light. Right eye exhibits no discharge. Left eye exhibits no discharge. No scleral icterus.  The patient understands and he should get an eye exam every 2 years and this will be due soon.  Neck: Normal range of motion. Neck supple. No thyromegaly present.  No bruits thyromegaly or anterior cervical adenopathy  Cardiovascular: Normal rate, regular rhythm, normal heart sounds and intact distal pulses.   No murmur heard. The heart has a regular rate and rhythm at 60/m  Pulmonary/Chest: Effort normal and breath sounds normal. No respiratory distress. He has no wheezes. He has no rales. He exhibits no tenderness.  No axillary adenopathy and chest sounds were clear anteriorly and posteriorly  Abdominal: Soft. Bowel sounds are normal. He exhibits no mass. There is no tenderness. There is no rebound and no guarding.  No abdominal tenderness masses or organ enlargement bruits or inguinal adenopathy  Genitourinary: Rectum normal and penis normal.  Genitourinary Comments: The prostate was slightly enlarged but soft. The patient does have some prominent hemorrhoids that he says not giving him any trouble. There were no rectal masses palpated. The external genitalia were normal without hernias being palpated.  Musculoskeletal: Normal range of motion. He exhibits no edema.  Lymphadenopathy:    He has no cervical adenopathy.  Neurological: He is alert and oriented to person, place, and time. He has normal reflexes. No cranial nerve deficit.  Skin: Skin is warm and dry. No rash noted.  Psychiatric: He has a normal mood and affect. His behavior is normal. Judgment and thought content normal.  Nursing note and vitals reviewed.   BP 122/76 (BP Location: Left Arm)   Pulse 61   Temp 97.7 F (36.5 C) (Oral)   Ht 5\' 11"  (1.803 m)   Wt 157 lb (71.2 kg)   BMI 21.90 kg/m     EKG with results pending ===     Assessment & Plan:  1. Benign prostatic  hyperplasia, unspecified whether lower urinary tract symptoms present -The PSA was normal. The rectal exam was negative other than some large external hemorrhoids. - Urinalysis, Complete  2. Health care maintenance -Patient will receive his Pneumovax today and will check on his insurance regarding the shingles shot -ETT is planned - DG Chest 2 View; Future - Urinalysis, Complete - EKG 12-Lead  3. Vitamin D deficiency -Continue with vitamin D replacement  4. External hemorrhoids -The patient is currently not having any problems with these hemorrhoids.  5. Erectile dysfunction due to arterial insufficiency -Continue with generic Viagra as needed   No orders of the defined types were placed in this encounter.    Patient Instructions                       Medicare Annual Wellness Visit  [facility] and the medical providers at [facility] strive to bring you the best medical care.  In doing so we not only want to address your current medical conditions and concerns but also to detect new conditions early and prevent illness, disease  and health-related problems.    Medicare offers a yearly Wellness Visit which allows our clinical staff to assess your need for preventative services including immunizations, lifestyle education, counseling to decrease risk of preventable diseases and screening for fall risk and other medical concerns.    This visit is provided free of charge (no copay) for all Medicare recipients. The clinical pharmacists at [facility] have begun to conduct these Wellness Visits which will also include a thorough review of all your medications.    As you primary medical provider recommend that you make an appointment for your Annual Wellness Visit if you have not done so already this year.  You may set up this appointment before you leave today or you may call back ([phone]) and schedule an appointment.  Please make sure when  you call that you mention that you are scheduling your Annual Wellness Visit with the clinical pharmacist so that the appointment may be made for the proper length of time.     Continue current medications. Continue good therapeutic lifestyle changes which include good diet and exercise. Fall precautions discussed with patient. If an FOBT was given today- please return it to our front desk. If you are over 59 years old - you may need Prevnar 38 or the adult Pneumonia vaccine.  **Flu shots are available--- please call and schedule a FLU-CLINIC appointment**  After your visit with [place] today you will receive a survey in the mail or online from [employer] regarding your care with [place]. Please take a moment to fill this out. Your feedback is very important to [place] as you can help [place] better understand your patient needs as well as improve your experience and satisfaction. WE CARE ABOUT YOU!!!   Please check with your insurance regarding the shingles shot We will arrange for you to have an exercise treadmill test with one of our providers This summer drink plenty of fluids and stay well hydrated We will also try to speak with the gastroenterologist and find out when your next colonoscopy is due. If you do not hear from [place] within a week's time be sure and get back in touch with [place]. Please return the FOBT We will call you with the results of the chest x-ray as soon as those results are returned Do not forget to get your eye exam every 2 years and make sure that the ophthalmologist since [place] a copy of that report    Arrie Senate MD

## 2016-11-07 NOTE — Patient Instructions (Addendum)
Medicare Annual Wellness Visit  [facility] and the medical providers at [facility] strive to bring you the best medical care.  In doing so we not only want to address your current medical conditions and concerns but also to detect new conditions early and prevent illness, disease and health-related problems.    Medicare offers a yearly Wellness Visit which allows our clinical staff to assess your need for preventative services including immunizations, lifestyle education, counseling to decrease risk of preventable diseases and screening for fall risk and other medical concerns.    This visit is provided free of charge (no copay) for all Medicare recipients. The clinical pharmacists at [facility] have begun to conduct these Wellness Visits which will also include a thorough review of all your medications.    As you primary medical provider recommend that you make an appointment for your Annual Wellness Visit if you have not done so already this year.  You may set up this appointment before you leave today or you may call back ([phone]) and schedule an appointment.  Please make sure when you call that you mention that you are scheduling your Annual Wellness Visit with the clinical pharmacist so that the appointment may be made for the proper length of time.     Continue current medications. Continue good therapeutic lifestyle changes which include good diet and exercise. Fall precautions discussed with patient. If an FOBT was given today- please return it to our front desk. If you are over 78 years old - you may need Prevnar 57 or the adult Pneumonia vaccine.  **Flu shots are available--- please call and schedule a FLU-CLINIC appointment**  After your visit with [place] today you will receive a survey in the mail or online from [employer] regarding your care with [place]. Please take a moment to fill this out. Your feedback is very  important to [place] as you can help [place] better understand your patient needs as well as improve your experience and satisfaction. WE CARE ABOUT YOU!!!   Please check with your insurance regarding the shingles shot We will arrange for you to have an exercise treadmill test with one of our providers This summer drink plenty of fluids and stay well hydrated We will also try to speak with the gastroenterologist and find out when your next colonoscopy is due. If you do not hear from [place] within a week's time be sure and get back in touch with [place]. Please return the FOBT We will call you with the results of the chest x-ray as soon as those results are returned Do not forget to get your eye exam every 2 years and make sure that the ophthalmologist since [place] a copy of that report

## 2016-11-07 NOTE — Addendum Note (Signed)
Addended by: Zannie Cove on: 11/07/2016 12:35 PM   Modules accepted: Orders

## 2016-11-10 LAB — HEPATITIS C ANTIBODY

## 2016-11-10 LAB — SPECIMEN STATUS REPORT

## 2016-11-12 ENCOUNTER — Telehealth: Payer: Self-pay | Admitting: *Deleted

## 2016-11-12 NOTE — Telephone Encounter (Signed)
Please review for treadmill  

## 2016-11-21 NOTE — Telephone Encounter (Signed)
Appointment scheduled for 01/23/2017 @ 8:30am. Instructions placed in mail. Billing department said that Bradycardia and hyperlipidemia should cover.

## 2016-11-21 NOTE — Telephone Encounter (Signed)
As far as physically and medically I think is a good candidate for a low risk treadmill, coverage is an issue as I don't see any diagnosis that could give [place] a reason to get this. We may want to check with billing prior to scheduling him to make sure it would be covered. I did notice that he had bradycardia on EKG and that may be a diagnosis that we could use

## 2017-01-23 ENCOUNTER — Ambulatory Visit (INDEPENDENT_AMBULATORY_CARE_PROVIDER_SITE_OTHER): Payer: Medicare Other

## 2017-01-23 DIAGNOSIS — Z8249 Family history of ischemic heart disease and other diseases of the circulatory system: Secondary | ICD-10-CM

## 2017-01-23 DIAGNOSIS — E7800 Pure hypercholesterolemia, unspecified: Secondary | ICD-10-CM

## 2017-01-27 ENCOUNTER — Ambulatory Visit (INDEPENDENT_AMBULATORY_CARE_PROVIDER_SITE_OTHER): Payer: Medicare Other | Admitting: *Deleted

## 2017-01-27 ENCOUNTER — Encounter: Payer: Self-pay | Admitting: *Deleted

## 2017-01-27 VITALS — BP 99/65 | HR 56

## 2017-01-27 DIAGNOSIS — Z0000 Encounter for general adult medical examination without abnormal findings: Secondary | ICD-10-CM | POA: Diagnosis not present

## 2017-01-27 NOTE — Patient Instructions (Addendum)
  [name] ,  Thank you for taking time to come for your Medicare Wellness Visit. I appreciate your ongoing commitment to your health goals. Please review the following plan we discussed and let me know if I can assist you in the future.    This is a list of the screening recommended for you and due dates:  Health Maintenance  Topic Date Due  . Flu Shot  12/11/2016  . Colon Cancer Screening  04/12/2017  . Tetanus Vaccine  02/27/2020  .  Hepatitis C: One time screening is recommended by Center for Disease Control  (CDC) for  adults born from [birth year] through 1967.   Completed  . Pneumonia vaccines  Completed   Bring a copy of Advance Directives to our office.   You should be due for a colonoscopy in Dec 2018. If you don't hear from [facility] let us know.   Check on coverage of Shingrix (shingles vaccine)

## 2017-01-27 NOTE — Progress Notes (Signed)
Subjective:   Matthew Vasquez is a 66 y.o. male who presents for an Initial Medicare Annual Wellness Visit. Matthew Vasquez is retired and lives at home with his wife. They have one adult son and one adult daughter and 4 grandchildren. He enjoys skiing and is active daily walking and cycling. He also mows several yards.   Review of Systems   Reports that his health is about the same as last year.   Cardiac Risk Factors include: dyslipidemia;male gender;advanced age (>46men, >57 women)   Other systems negative today.     Objective:    Today's Vitals   01/27/17 0959  BP: 99/65  Pulse: (!) 56   There is no height or weight on file to calculate BMI.  Current Medications (verified) Outpatient Encounter Prescriptions as of 01/27/2017  Medication Sig  . aspirin 81 MG tablet Take 81 mg by mouth daily.  . cholecalciferol (VITAMIN D) 1000 UNITS tablet Take 1,000 Units by mouth daily.  . sildenafil (REVATIO) 20 MG tablet TAKE 2 TO 5 TABS AS NEEDED PRIOR TO SEXUAL ACTIVITY   No facility-administered encounter medications on file as of 01/27/2017.     Allergies (verified) Patient has no known allergies.   History: Past Medical History:  Diagnosis Date  . BPH (benign prostatic hyperplasia)   . History of rectal bleeding   . Hyperlipidemia    Past Surgical History:  Procedure Laterality Date  . TONSILLECTOMY     Family History  Problem Relation Age of Onset  . Heart disease Mother   . Cancer Father        prostate and bone  . Epilepsy Brother   . Heart disease Sister    Social History   Occupational History  . Not on file.   Social History Main Topics  . Smoking status: Never Smoker  . Smokeless tobacco: Never Used  . Alcohol use 1.1 oz/week    1 Glasses of wine, 1 Standard drinks or equivalent per week  . Drug use: No  . Sexual activity: Yes   Tobacco Counseling No tobacco use  Activities of Daily Living In your present state of health, do you have any difficulty  performing the following activities: 01/27/2017  Hearing? Y  Comment mild tinnitus, wife has said that hearing isn't as good as it used to be  Vision? N  Difficulty concentrating or making decisions? N  Walking or climbing stairs? N  Dressing or bathing? N  Doing errands, shopping? N  Preparing Food and eating ? N  Using the Toilet? N  In the past six months, have you accidently leaked urine? N  Do you have problems with loss of bowel control? N  Managing your Medications? N  Managing your Finances? N  Housekeeping or managing your Housekeeping? N  Some recent data might be hidden    Immunizations and Health Maintenance Immunization History  Administered Date(s) Administered  . Influenza,inj,Quad PF,6+ Mos 03/25/2013, 03/22/2014, 02/23/2015  . Influenza-Unspecified 02/22/2016  . Pneumococcal Conjugate-13 09/27/2014  . Pneumococcal Polysaccharide-23 11/07/2016  . Tdap 02/26/2010   Health Maintenance Due  Topic Date Due  . INFLUENZA VACCINE  12/11/2016    Patient Care Team: Chipper Herb, MD as PCP - General (Family Medicine)  Marygrace Drought, MD-opthalmology  No hospitalizations, ER visits, or surgeries this past year.     Assessment:   This is a routine wellness examination for [facility].   Hearing/Vision screen No deficits noted during visit. Last eye exam was last year.  Dietary issues and exercise activities discussed: Current Exercise Habits: Home exercise routine, Type of exercise: walking (bycling), Time (Minutes): 40, Frequency (Times/Week): 4, Weekly Exercise (Minutes/Week): 160, Intensity: Moderate  Goals    . Exercise 150 minutes per week (moderate activity)       Depression Screen PHQ 2/9 Scores 01/27/2017 11/07/2016 09/27/2014  PHQ - 2 Score 0 0 0    Fall Risk Fall Risk  01/27/2017 11/07/2016 09/27/2014  Falls in the past year? No No No    Cognitive Function: MMSE - Mini Mental State Exam 01/27/2017  Orientation to time 5  Orientation to Place 5    Registration 3  Attention/ Calculation 5  Recall 2  Language- name 2 objects 2  Language- repeat 1  Language- follow 3 step command 3  Language- read & follow direction 1  Write a sentence 1  Copy design 1  Total score 29    Normal exam    Screening Tests Health Maintenance  Topic Date Due  . INFLUENZA VACCINE  12/11/2016  . COLONOSCOPY  04/12/2017  . TETANUS/TDAP  02/27/2020  . Hepatitis C Screening  Completed  . PNA vac Low Risk Adult  Completed        Plan:   Consider Shingrix.  Keep yearly eye exam appointment.  Physical scheduled with Dr Laurance Flatten in July 2019.  Continue to stay active daily.  I have personally reviewed and noted the following in the patient's chart:   . Medical and social history . Use of alcohol, tobacco or illicit drugs  . Current medications and supplements . Functional ability and status . Nutritional status . Physical activity . Advanced directives . List of other physicians . Hospitalizations, surgeries, and ER visits in previous 12 months . Vitals . Screenings to include cognitive, depression, and falls . Referrals and appointments  In addition, I have reviewed and discussed with patient certain preventive protocols, quality metrics, and best practice recommendations. A written personalized care plan for preventive services as well as general preventive health recommendations were provided to patient.     Chong Sicilian, RN  01/27/2017    I have reviewed and agree with the above AWV documentation.   Mary-Margaret Hassell Done, FNP

## 2017-01-29 LAB — EXERCISE TOLERANCE TEST
CHL CUP MPHR: 152 {beats}/min
CSEPEDS: 51 s
CSEPEW: 8.8 METS
CSEPHR: 85 %
Exercise duration (min): 7 min
Peak HR: 130 {beats}/min
RPE: 5
Rest HR: 54 {beats}/min

## 2017-05-16 ENCOUNTER — Encounter: Payer: Self-pay | Admitting: Internal Medicine

## 2017-11-05 ENCOUNTER — Other Ambulatory Visit: Payer: Self-pay | Admitting: *Deleted

## 2017-11-05 DIAGNOSIS — E7800 Pure hypercholesterolemia, unspecified: Secondary | ICD-10-CM

## 2017-11-05 DIAGNOSIS — Z0000 Encounter for general adult medical examination without abnormal findings: Secondary | ICD-10-CM

## 2017-11-05 DIAGNOSIS — Z8042 Family history of malignant neoplasm of prostate: Secondary | ICD-10-CM

## 2017-11-05 DIAGNOSIS — E559 Vitamin D deficiency, unspecified: Secondary | ICD-10-CM

## 2017-11-05 DIAGNOSIS — Z8249 Family history of ischemic heart disease and other diseases of the circulatory system: Secondary | ICD-10-CM

## 2017-11-05 DIAGNOSIS — N400 Enlarged prostate without lower urinary tract symptoms: Secondary | ICD-10-CM

## 2017-11-06 ENCOUNTER — Other Ambulatory Visit: Payer: Medicare Other

## 2017-11-06 DIAGNOSIS — E7800 Pure hypercholesterolemia, unspecified: Secondary | ICD-10-CM

## 2017-11-06 DIAGNOSIS — Z8042 Family history of malignant neoplasm of prostate: Secondary | ICD-10-CM

## 2017-11-06 DIAGNOSIS — Z0000 Encounter for general adult medical examination without abnormal findings: Secondary | ICD-10-CM | POA: Diagnosis not present

## 2017-11-06 DIAGNOSIS — E559 Vitamin D deficiency, unspecified: Secondary | ICD-10-CM

## 2017-11-06 DIAGNOSIS — N400 Enlarged prostate without lower urinary tract symptoms: Secondary | ICD-10-CM | POA: Diagnosis not present

## 2017-11-06 DIAGNOSIS — Z8249 Family history of ischemic heart disease and other diseases of the circulatory system: Secondary | ICD-10-CM

## 2017-11-07 LAB — CBC WITH DIFFERENTIAL/PLATELET
BASOS: 1 %
Basophils Absolute: 0 10*3/uL (ref 0.0–0.2)
EOS (ABSOLUTE): 0.3 10*3/uL (ref 0.0–0.4)
Eos: 6 %
Hematocrit: 40.6 % (ref 37.5–51.0)
Hemoglobin: 13.7 g/dL (ref 13.0–17.7)
IMMATURE GRANULOCYTES: 0 %
Immature Grans (Abs): 0 10*3/uL (ref 0.0–0.1)
LYMPHS ABS: 2 10*3/uL (ref 0.7–3.1)
Lymphs: 35 %
MCH: 31.3 pg (ref 26.6–33.0)
MCHC: 33.7 g/dL (ref 31.5–35.7)
MCV: 93 fL (ref 79–97)
MONOS ABS: 0.6 10*3/uL (ref 0.1–0.9)
Monocytes: 10 %
NEUTROS ABS: 2.8 10*3/uL (ref 1.4–7.0)
NEUTROS PCT: 48 %
PLATELETS: 239 10*3/uL (ref 150–450)
RBC: 4.38 x10E6/uL (ref 4.14–5.80)
RDW: 13.3 % (ref 12.3–15.4)
WBC: 5.7 10*3/uL (ref 3.4–10.8)

## 2017-11-07 LAB — BMP8+EGFR
BUN/Creatinine Ratio: 22 (ref 10–24)
BUN: 19 mg/dL (ref 8–27)
CHLORIDE: 107 mmol/L — AB (ref 96–106)
CO2: 23 mmol/L (ref 20–29)
Calcium: 9 mg/dL (ref 8.6–10.2)
Creatinine, Ser: 0.86 mg/dL (ref 0.76–1.27)
GFR, EST AFRICAN AMERICAN: 103 mL/min/{1.73_m2} (ref >59)
GFR, EST NON AFRICAN AMERICAN: 89 mL/min/{1.73_m2} (ref >59)
Glucose: 95 mg/dL (ref 65–99)
POTASSIUM: 4.6 mmol/L (ref 3.5–5.2)
Sodium: 143 mmol/L (ref 134–144)

## 2017-11-07 LAB — HEPATIC FUNCTION PANEL
ALK PHOS: 80 IU/L (ref 39–117)
ALT: 18 IU/L (ref 0–44)
AST: 19 IU/L (ref 0–40)
Albumin: 3.9 g/dL (ref 3.6–4.8)
Bilirubin Total: 0.2 mg/dL (ref 0.0–1.2)
Bilirubin, Direct: 0.08 mg/dL (ref 0.00–0.40)
Total Protein: 6.6 g/dL (ref 6.0–8.5)

## 2017-11-07 LAB — PSA, TOTAL AND FREE
PSA, Free Pct: 16.9 %
PSA, Free: 0.27 ng/mL
Prostate Specific Ag, Serum: 1.6 ng/mL (ref 0.0–4.0)

## 2017-11-07 LAB — VITAMIN D 25 HYDROXY (VIT D DEFICIENCY, FRACTURES): Vit D, 25-Hydroxy: 55.9 ng/mL (ref 30.0–100.0)

## 2017-11-07 LAB — LIPID PANEL
CHOLESTEROL TOTAL: 170 mg/dL (ref 100–199)
Chol/HDL Ratio: 3 ratio (ref 0.0–5.0)
HDL: 56 mg/dL (ref >39)
LDL CALC: 104 mg/dL — AB (ref 0–99)
Triglycerides: 49 mg/dL (ref 0–149)
VLDL Cholesterol Cal: 10 mg/dL (ref 5–40)

## 2017-11-07 LAB — THYROID PANEL WITH TSH
FREE THYROXINE INDEX: 1.4 (ref 1.2–4.9)
T3 UPTAKE RATIO: 25 % (ref 24–39)
T4, Total: 5.4 ug/dL (ref 4.5–12.0)
TSH: 3.28 u[IU]/mL (ref 0.450–4.500)

## 2017-11-10 ENCOUNTER — Ambulatory Visit: Payer: Medicare Other | Admitting: Family Medicine

## 2017-11-10 ENCOUNTER — Encounter: Payer: Self-pay | Admitting: Family Medicine

## 2017-11-10 VITALS — BP 111/71 | HR 58 | Temp 97.6°F | Ht 71.0 in | Wt 165.0 lb

## 2017-11-10 DIAGNOSIS — N400 Enlarged prostate without lower urinary tract symptoms: Secondary | ICD-10-CM

## 2017-11-10 DIAGNOSIS — E559 Vitamin D deficiency, unspecified: Secondary | ICD-10-CM | POA: Diagnosis not present

## 2017-11-10 DIAGNOSIS — Z8042 Family history of malignant neoplasm of prostate: Secondary | ICD-10-CM | POA: Diagnosis not present

## 2017-11-10 DIAGNOSIS — E7800 Pure hypercholesterolemia, unspecified: Secondary | ICD-10-CM | POA: Diagnosis not present

## 2017-11-10 DIAGNOSIS — Z8249 Family history of ischemic heart disease and other diseases of the circulatory system: Secondary | ICD-10-CM

## 2017-11-10 DIAGNOSIS — K644 Residual hemorrhoidal skin tags: Secondary | ICD-10-CM | POA: Diagnosis not present

## 2017-11-10 DIAGNOSIS — N5201 Erectile dysfunction due to arterial insufficiency: Secondary | ICD-10-CM | POA: Diagnosis not present

## 2017-11-10 LAB — MICROSCOPIC EXAMINATION
BACTERIA UA: NONE SEEN
Epithelial Cells (non renal): NONE SEEN /hpf (ref 0–10)
RENAL EPITHEL UA: NONE SEEN /HPF
WBC, UA: NONE SEEN /hpf (ref 0–5)

## 2017-11-10 LAB — URINALYSIS, COMPLETE
Bilirubin, UA: NEGATIVE
Glucose, UA: NEGATIVE
Ketones, UA: NEGATIVE
LEUKOCYTES UA: NEGATIVE
Nitrite, UA: NEGATIVE
PH UA: 7.5 (ref 5.0–7.5)
PROTEIN UA: NEGATIVE
RBC, UA: NEGATIVE
Specific Gravity, UA: 1.02 (ref 1.005–1.030)
Urobilinogen, Ur: 0.2 mg/dL (ref 0.2–1.0)

## 2017-11-10 MED ORDER — SILDENAFIL CITRATE 20 MG PO TABS: ORAL_TABLET | ORAL | 5 refills | Status: DC

## 2017-11-10 NOTE — Progress Notes (Signed)
Subjective:    Patient ID: Matthew Vasquez, male    DOB: 01/06/1951, 67 y.o.   MRN: [id number]  HPI Pt here for follow up and management of chronic medical problems which includes hyperlipidemia. He is taking medication regularly.  Time continues to do well overall.  He is requesting refill on his sildenafil.  He complains of some slight congestion in his throat today.  He has had lab work done and the good news is is that his PSA is stable from 1 year ago.  All thyroid function tests were normal.  Cholesterol numbers with traditional lipid testing were good except the LDL C was elevated at 104 and this is only a slight elevation.  The CBC had a normal white blood cell count and excellent hemoglobin at 13.7 and adequate platelet count.  The blood sugar was good at 95.  The creatinine was good and within normal limits and stable at 0.86.  All of the electrolytes including potassium were good except the chloride was elevated by one-point.  Vitamin D level was excellent at 55.9 and all liver function tests were within normal limits.  The patient is doing well overall.  He is up-to-date on all of his health maintenance except for getting a colonoscopy.  He denies any chest pain pressure tightness or shortness of breath.  He denies any trouble with his intestinal tract including nausea vomiting diarrhea blood in the stool or black tarry bowel movements.  He is passing his water without problems.  He has no problems with erectile dysfunction is always he is using the generic Viagra.  He does have occasional chest soreness that is only brief in nature and he is active physically and does not have any persistent chest pain.    Patient Active Problem List   Diagnosis Date Noted  . BPH (benign prostatic hyperplasia) 03/25/2013  . Hyperlipidemia 03/25/2013  . External hemorrhoids 03/25/2013  . Erectile dysfunction 03/25/2013  . Microscopic colitis, with secondary loose stools controlled comfortably with  Imodium 03/25/2013  . FHx: prostate cancer 03/25/2013   Outpatient Encounter Medications as of 11/10/2017  Medication Sig  . cholecalciferol (VITAMIN D) 1000 UNITS tablet Take 1,000 Units by mouth daily.  . sildenafil (REVATIO) 20 MG tablet TAKE 2 TO 5 TABS AS NEEDED PRIOR TO SEXUAL ACTIVITY  . [DISCONTINUED] aspirin 81 MG tablet Take 81 mg by mouth daily.   No facility-administered encounter medications on file as of 11/10/2017.       Review of Systems  Constitutional: Negative.   HENT: Positive for congestion (throat ).   Eyes: Negative.   Respiratory: Negative.   Cardiovascular: Negative.   Gastrointestinal: Negative.   Endocrine: Negative.   Genitourinary: Negative.   Musculoskeletal: Negative.   Skin: Negative.   Allergic/Immunologic: Negative.   Neurological: Negative.   Hematological: Negative.   Psychiatric/Behavioral: Negative.        Objective:   Physical Exam  Constitutional: He is oriented to person, place, and time. He appears well-developed and well-nourished. No distress.  The patient is pleasant and alert and in good spirits today.  He is a good historian.  HENT:  Head: Normocephalic and atraumatic.  Right Ear: External ear normal.  Left Ear: External ear normal.  Nose: Nose normal.  Mouth/Throat: Oropharynx is clear and moist. No oropharyngeal exudate.  Minimal nasal congestion bilaterally and throat is clear without adenopathy  Eyes: Pupils are equal, round, and reactive to light. Conjunctivae and EOM are normal. Right eye exhibits no discharge.  Left eye exhibits no discharge. No scleral icterus.  Neck: Normal range of motion. Neck supple. No tracheal deviation present. No thyromegaly present.  No bruits thyromegaly or anterior cervical adenopathy  Cardiovascular: Normal rate, regular rhythm, normal heart sounds and intact distal pulses.  No murmur heard. Heart is regular at 72/min  Pulmonary/Chest: Effort normal and breath sounds normal. No respiratory  distress. He has no wheezes. He has no rales. He exhibits no tenderness.  Clear anteriorly and posteriorly and no axillary adenopathy chest wall masses or chest wall tenderness  Abdominal: Soft. Bowel sounds are normal. He exhibits no mass. There is no tenderness. There is no rebound and no guarding.  No liver or spleen enlargement no bruits no masses no inguinal adenopathy  Genitourinary: Rectum normal and penis normal.  Genitourinary Comments: The prostate is enlarged but smooth without lumps or masses.  There were no rectal masses.  There were external hemorrhoids.  The external genitalia were within normal limits with no inguinal hernias being palpated.  The testicles were normal.  Musculoskeletal: Normal range of motion. He exhibits no edema.  Lymphadenopathy:    He has no cervical adenopathy.  Neurological: He is alert and oriented to person, place, and time. He has normal reflexes. No cranial nerve deficit.  Skin: Skin is warm and dry. No rash noted.  Psychiatric: He has a normal mood and affect. His behavior is normal. Judgment and thought content normal.  Mood affect and behavior of normal  Nursing note and vitals reviewed.  BP 111/71 (BP Location: Left Arm)   Pulse (!) 58   Temp 97.6 F (36.4 C) (Oral)   Ht 5\' 11"  (1.803 m)   Wt 165 lb (74.8 kg)   BMI 23.01 kg/m         Assessment & Plan:  1. Benign prostatic hyperplasia, unspecified whether lower urinary tract symptoms present - Urinalysis, Complete  2. Family history of heart disease -Brought numbers are good and the patient will continue with his current therapeutic lifestyle changes  3. Vitamin D deficiency -Vitamin D level is excellent he will continue with current treatment  4. Pure hypercholesterolemia -Continue with aggressive therapeutic lifestyle changes  5. FHx: prostate cancer -Continue getting yearly rectal exams and PSAs  6. External hemorrhoids -Currently these are not causing any problems for  him  7. Erectile dysfunction due to arterial insufficiency -Continue with generic sildenafil  Meds ordered this encounter  Medications  . sildenafil (REVATIO) 20 MG tablet    Sig: TAKE 2 TO 5 TABS AS NEEDED PRIOR TO SEXUAL ACTIVITY    Dispense:  50 tablet    Refill:  5   Patient Instructions                       Medicare Annual Wellness Visit  [facility] and the medical providers at [facility] strive to bring you the best medical care.  In doing so we not only want to address your current medical conditions and concerns but also to detect new conditions early and prevent illness, disease and health-related problems.    Medicare offers a yearly Wellness Visit which allows our clinical staff to assess your need for preventative services including immunizations, lifestyle education, counseling to decrease risk of preventable diseases and screening for fall risk and other medical concerns.    This visit is provided free of charge (no copay) for all Medicare recipients. The clinical pharmacists at [facility] have begun to  conduct these Wellness Visits which will also include a thorough review of all your medications.    As you primary medical provider recommend that you make an appointment for your Annual Wellness Visit if you have not done so already this year.  You may set up this appointment before you leave today or you may call back [phone]) and schedule an appointment.  Please make sure when you call that you mention that you are scheduling your Annual Wellness Visit with the clinical pharmacist so that the appointment may be made for the proper length of time.     Continue current medications. Continue good therapeutic lifestyle changes which include good diet and exercise. Fall precautions discussed with patient. If an FOBT was given today- please return it to our front desk. If you are over 41 years old - you may need Prevnar 83 or  the adult Pneumonia vaccine.  **Flu shots are available--- please call and schedule a FLU-CLINIC appointment**  After your visit with [place] today you will receive a survey in the mail or online from [employer] regarding your care with [place]. Please take a moment to fill this out. Your feedback is very important to [place] as you can help [place] better understand your patient needs as well as improve your experience and satisfaction. WE CARE ABOUT YOU!!!   Please do not forget to get your colonoscopy scheduled.  We use Dr. Lenna Sciara [facility]'s group in [place]. Drink more water and fluids Get your eye exams every 2 years Get your rectal exam and PSA yearly Take Mucinex maximum strength 1 twice daily with a large glass of water and take this a little more regularly for a couple weeks and see if it makes any difference with the congestion. If it does not call back and we will call you in a prescription for a leukotriene receptor blocker.    Arrie Senate MD

## 2017-11-10 NOTE — Patient Instructions (Addendum)
Medicare Annual Wellness Visit  Matthew Vasquez and the medical providers at [facility] strive to bring you the best medical care.  In doing so we not only want to address your current medical conditions and concerns but also to detect new conditions early and prevent illness, disease and health-related problems.    Medicare offers a yearly Wellness Visit which allows our clinical staff to assess your need for preventative services including immunizations, lifestyle education, counseling to decrease risk of preventable diseases and screening for fall risk and other medical concerns.    This visit is provided free of charge (no copay) for all Medicare recipients. The clinical pharmacists at [facility] have begun to conduct these Wellness Visits which will also include a thorough review of all your medications.    As you primary medical provider recommend that you make an appointment for your Annual Wellness Visit if you have not done so already this year.  You may set up this appointment before you leave today or you may call back [phone]) and schedule an appointment.  Please make sure when you call that you mention that you are scheduling your Annual Wellness Visit with the clinical pharmacist so that the appointment may be made for the proper length of time.     Continue current medications. Continue good therapeutic lifestyle changes which include good diet and exercise. Fall precautions discussed with patient. If an FOBT was given today- please return it to our front desk. If you are over 1 years old - you may need Prevnar 26 or the adult Pneumonia vaccine.  **Flu shots are available--- please call and schedule a FLU-CLINIC appointment**  After your visit with [place] today you will receive a survey in the mail or online from [employer] regarding your care with [place]. Please take a moment to fill this out. Your feedback is very  important to [place] as you can help [place] better understand your patient needs as well as improve your experience and satisfaction. WE CARE ABOUT YOU!!!   Please do not forget to get your colonoscopy scheduled.  We use Dr. Lenna Sci[facility]'s group in [place]. Drink more water and fluids Get your eye exams every 2 years Get your rectal exam and PSA yearly Take Mucinex maximum strength 1 twice daily with a large glass of water and take this a little more regularly for a couple weeks and see if it makes any difference with the congestion. If it does not call back and we will call you in a prescription for a leukotriene receptor blocker.

## 2017-12-01 ENCOUNTER — Encounter: Payer: Self-pay | Admitting: Family Medicine

## 2017-12-02 DIAGNOSIS — Z0000 Encounter for general adult medical examination without abnormal findings: Secondary | ICD-10-CM | POA: Diagnosis not present

## 2018-01-28 ENCOUNTER — Encounter: Payer: Medicare Other | Admitting: *Deleted

## 2018-01-29 ENCOUNTER — Ambulatory Visit (INDEPENDENT_AMBULATORY_CARE_PROVIDER_SITE_OTHER): Payer: Medicare Other | Admitting: *Deleted

## 2018-01-29 ENCOUNTER — Encounter: Payer: Self-pay | Admitting: *Deleted

## 2018-01-29 VITALS — BP 112/66 | HR 53 | Ht 71.0 in | Wt 161.0 lb

## 2018-01-29 DIAGNOSIS — Z0000 Encounter for general adult medical examination without abnormal findings: Secondary | ICD-10-CM | POA: Diagnosis not present

## 2018-01-29 NOTE — Progress Notes (Addendum)
Subjective:   Matthew Vasquez is a 67 y.o. male who presents for a Medicare Annual Wellness Visit. Matthew Vasquez lives at home with his wife. He is retired but stays active. They typically walk about 45 minutes four to 6 times a week. He is also active around his home and yard. He has two adult children and 4 grandchildren.    Review of Systems    Patient reports that his overall health is unchanged compared to last year.  Musculoskeletal: some hip pain. Takes ibuprofen at times for that. Pain is manageable.   Cardiac: Risk Factors- Advanced age, hyperlipidemia  All other systems negative       Current Medications (verified) Outpatient Encounter Medications as of 01/29/2018  Medication Sig  . cholecalciferol (VITAMIN D) 1000 UNITS tablet Take 1,000 Units by mouth daily.  . sildenafil (REVATIO) 20 MG tablet TAKE 2 TO 5 TABS AS NEEDED PRIOR TO SEXUAL ACTIVITY   No facility-administered encounter medications on file as of 01/29/2018.     Allergies (verified) Patient has no known allergies.   History: Past Medical History:  Diagnosis Date  . BPH (benign prostatic hyperplasia)   . History of rectal bleeding   . Hyperlipidemia    Past Surgical History:  Procedure Laterality Date  . TONSILLECTOMY     Family History  Problem Relation Age of Onset  . Heart disease Mother   . Cancer Father        prostate and bone  . Epilepsy Brother   . Heart disease Sister    Social History   Socioeconomic History  . Marital status: Married    Spouse name: Not on file  . Number of children: Not on file  . Years of education: Not on file  . Highest education level: Not on file  Occupational History  . Not on file  Social Needs  . Financial resource strain: Not on file  . Food insecurity:    Worry: Not on file    Inability: Not on file  . Transportation needs:    Medical: Not on file    Non-medical: Not on file  Tobacco Use  . Smoking status: Never Smoker  . Smokeless  tobacco: Never Used  Substance and Sexual Activity  . Alcohol use: Yes    Alcohol/week: 2.0 standard drinks    Types: 1 Glasses of wine, 1 Standard drinks or equivalent per week  . Drug use: No  . Sexual activity: Yes  Lifestyle  . Physical activity:    Days per week: Not on file    Minutes per session: Not on file  . Stress: Not on file  Relationships  . Social connections:    Talks on phone: Not on file    Gets together: Not on file    Attends religious service: Not on file    Active member of club or organization: Not on file    Attends meetings of clubs or organizations: Not on file    Relationship status: Not on file  Other Topics Concern  . Not on file  Social History Narrative  . Not on file    Tobacco Use No.  Clinical Intake:     Pain : No/denies pain     Nutritional Status: BMI of 19-24  Normal Diabetes: No  How often do you need to have someone help you when you read instructions, pamphlets, or other written materials from your doctor or pharmacy?: 1 - Never What is the last grade level  you completed in school?: Bachelor's degree in history and Animal nutritionist Needed?: No  Information entered by :: Chong Sicilian, RN  Activities of Daily Living In your present state of health, do you have any difficulty performing the following activities: 01/29/2018  Hearing? Y  Comment tinnitus bilaterally for years  Vision? N  Comment goes regularly for exams  Difficulty concentrating or making decisions? N  Walking or climbing stairs? N  Dressing or bathing? N  Doing errands, shopping? N  Preparing Food and eating ? N  Using the Toilet? N  In the past six months, have you accidently leaked urine? N  Do you have problems with loss of bowel control? N  Managing your Medications? N  Managing your Finances? N  Housekeeping or managing your Housekeeping? N  Some recent data might be hidden     Diet 3 real meals a day. Prepares them at  home Drink water, milk at breakfast and lunch One alcoholic drink in the evening  Exercise Current Exercise Habits: Home exercise routine, Type of exercise: walking, Time (Minutes): 45, Frequency (Times/Week): 7, Weekly Exercise (Minutes/Week): 315, Exercise limited by: cardiac condition(s)    Depression Screen PHQ 2/9 Scores 01/29/2018 11/10/2017 01/27/2017 11/07/2016  PHQ - 2 Score 0 0 0 0     Fall Risk Fall Risk  01/29/2018 11/10/2017 01/27/2017 11/07/2016 09/27/2014  Falls in the past year? No No No No No    Safety Is the patient's home free of loose throw rugs in walkways, pet beds, electrical cords, etc?   yes      Handrails on the stairs?   yes      Adequate lighting?   yes  Patient Care Team: Chipper Herb, MD as PCP - General (Family Medicine) Marygrace Drought, MD as Consulting Physician (Ophthalmology)  Hospitalizations, surgeries, and ER visits in previous 12 months No hospitalizations, ER visits, or surgeries this past year.   Objective:    Today's Vitals   01/29/18 1036  BP: 112/66  Pulse: (!) 53  Weight: 161 lb (73 kg)  Height: 5\' 11"  (1.803 m)   Body mass index is 22.45 kg/m.  Advanced Directives 01/29/2018 01/27/2017  Does Patient Have a Medical Advance Directive? Yes Yes  Type of Advance Directive Living will;Healthcare Power of [place];Living will  Does patient want to make changes to medical advance directive? No - Patient declined No - Patient declined  Copy of [place] in Chart? No - copy requested No - copy requested    Hearing/Vision  No hearing or vision deficits noted during visit.   Cognitive Function: MMSE - Mini Mental State Exam 01/29/2018 01/27/2017  Orientation to time 5 5  Orientation to Place 5 5  Registration 3 3  Attention/ Calculation 5 5  Recall 3 2  Language- name 2 objects 2 2  Language- repeat 1 1  Language- follow 3 step command 3 3  Language- read & follow direction 1 1   Write a sentence 1 1  Copy design 1 1  Total score 30 29       Normal Cognitive Function Screening: Yes    Immunizations and Health Maintenance Immunization History  Administered Date(s) Administered  . Influenza,inj,Quad PF,6+ Mos 03/25/2013, 03/22/2014, 02/23/2015  . Influenza-Unspecified 02/22/2016, 02/10/2017  . Pneumococcal Conjugate-13 09/27/2014  . Pneumococcal Polysaccharide-23 11/07/2016  . Tdap 02/26/2010  . Zoster Recombinat (Shingrix) 12/01/2017   Health Maintenance Due  Topic Date Due  . COLONOSCOPY  04/20/2017   Health Maintenance  Topic Date Due  . COLONOSCOPY  04/20/2017  . INFLUENZA VACCINE  10/12/2018 (Originally 12/11/2017)  . TETANUS/TDAP  02/27/2020  . Hepatitis C Screening  Completed  . PNA vac Low Risk Adult  Completed        Assessment:   This is a routine wellness examination for [facility].      Plan:    Goals    . Exercise 150 minutes per week (moderate activity)        Health Maintenance Recommendations: Influenza vaccine Colorectal cancer screening  Additional Screening Recommendations: Lung: Low Dose CT Chest recommended if Age 26-80 years, 30 pack-year currently smoking OR have quit w/in 15years. Patient does not qualify. Hepatitis C Screening recommended: no  Keep f/u with Chipper Herb, MD and any other specialty appointments you may have Continue current medications Schedule colonoscopy  Move carefully to avoid falls Aim for at least 150 minutes of moderate activity a week.  Reading or puzzles are a good way to exercise your brain Stay connected with friends and family. Social connections are beneficial to your emotional and mental health.   I have personally reviewed and noted the following in the patient's chart:   . Medical and social history . Use of alcohol, tobacco or illicit drugs  . Current medications and supplements . Functional ability and status . Nutritional status . Physical activity . Advanced  directives . List of other physicians . Hospitalizations, surgeries, and ER visits in previous 12 months . Vitals . Screenings to include cognitive, depression, and falls . Referrals and appointments  In addition, I have reviewed and discussed with patient certain preventive protocols, quality metrics, and best practice recommendations. A written personalized care plan for preventive services as well as general preventive health recommendations were provided to patient.     Chong Sicilian, RN   01/29/2018   I have reviewed and agree with the above AWV documentation.   Arrie Senate MD

## 2018-01-29 NOTE — Patient Instructions (Signed)
  [name] , Thank you for taking time to come for your Medicare Wellness Visit. I appreciate your ongoing commitment to your health goals. Please review the following plan we discussed and let me know if I can assist you in the future.   These are the goals we discussed: Goals    . Exercise 150 minutes per week (moderate activity)       This is a list of the screening recommended for you and due dates:  Health Maintenance  Topic Date Due  . Colon Cancer Screening  04/20/2017  . Flu Shot  12/11/2017  . Tetanus Vaccine  02/27/2020  .  Hepatitis C: One time screening is recommended by Center for Disease Control  (CDC) for  adults born from [birth year] through 1967.   Completed  . Pneumonia vaccines  Completed

## 2018-11-18 ENCOUNTER — Ambulatory Visit: Payer: Medicare Other | Admitting: Family Medicine

## 2019-01-05 DIAGNOSIS — H524 Presbyopia: Secondary | ICD-10-CM | POA: Diagnosis not present

## 2019-01-08 ENCOUNTER — Other Ambulatory Visit: Payer: Self-pay

## 2019-01-11 ENCOUNTER — Ambulatory Visit (INDEPENDENT_AMBULATORY_CARE_PROVIDER_SITE_OTHER): Payer: Medicare Other | Admitting: Family Medicine

## 2019-01-11 ENCOUNTER — Encounter: Payer: Self-pay | Admitting: Family Medicine

## 2019-01-11 ENCOUNTER — Other Ambulatory Visit: Payer: Self-pay

## 2019-01-11 VITALS — BP 122/71 | HR 62 | Temp 97.3°F | Ht 71.0 in | Wt 163.8 lb

## 2019-01-11 DIAGNOSIS — Z0000 Encounter for general adult medical examination without abnormal findings: Secondary | ICD-10-CM | POA: Diagnosis not present

## 2019-01-11 DIAGNOSIS — E7800 Pure hypercholesterolemia, unspecified: Secondary | ICD-10-CM

## 2019-01-11 DIAGNOSIS — N400 Enlarged prostate without lower urinary tract symptoms: Secondary | ICD-10-CM

## 2019-01-11 DIAGNOSIS — Z0001 Encounter for general adult medical examination with abnormal findings: Secondary | ICD-10-CM | POA: Diagnosis not present

## 2019-01-11 DIAGNOSIS — E559 Vitamin D deficiency, unspecified: Secondary | ICD-10-CM | POA: Diagnosis not present

## 2019-01-11 NOTE — Progress Notes (Signed)
BP 122/71   Pulse 62   Temp (!) 97.3 F (36.3 C) (Temporal)   Ht '5\' 11"'  (1.803 m)   Wt 163 lb 12.8 oz (74.3 kg)   BMI 22.85 kg/m    Subjective:   Patient ID: Matthew Vasquez, male    DOB: 03/27/1951, 68 y.o.   MRN: [id number]  HPI: Matthew Vasquez is a 68 y.o. male presenting on 01/11/2019 for Establish Care and Annual Exam   HPI Adult well exam and physical Patient is coming in for adult well exam and physical and recheck of chronic issues.  Is coming in for recheck of cholesterol and vitamin D and BPH.  He is currently using sildenafil and vitamin D but is on no other medications and has been trying to do dietary and lifestyle control.  He has maintained activity and tries to stay physically active.  Relevant past medical, surgical, family and social history reviewed and updated as indicated. Interim medical history since our last visit reviewed. Allergies and medications reviewed and updated.  Review of Systems  Constitutional: Negative for chills and fever.  Eyes: Negative for visual disturbance.  Respiratory: Negative for shortness of breath and wheezing.   Cardiovascular: Negative for chest pain and leg swelling.  Musculoskeletal: Negative for back pain and gait problem.  Skin: Negative for rash.  Neurological: Negative for dizziness, weakness and numbness.  All other systems reviewed and are negative.   Per HPI unless specifically indicated above   Allergies as of 01/11/2019   No Known Allergies     Medication List       Accurate as of January 11, 2019 11:59 PM. If you have any questions, ask your nurse or doctor.        cholecalciferol 1000 units tablet Commonly known as: VITAMIN D Take 1,000 Units by mouth daily.   sildenafil 20 MG tablet Commonly known as: REVATIO TAKE 2 TO 5 TABS AS NEEDED PRIOR TO SEXUAL ACTIVITY        Objective:   BP 122/71   Pulse 62   Temp (!) 97.3 F (36.3 C) (Temporal)   Ht '5\' 11"'  (1.803 m)   Wt 163 lb 12.8 oz (74.3  kg)   BMI 22.85 kg/m   Wt Readings from Last 3 Encounters:  01/11/19 163 lb 12.8 oz (74.3 kg)  01/29/18 161 lb (73 kg)  11/10/17 165 lb (74.8 kg)    Physical Exam Vitals signs and nursing note reviewed.  Constitutional:      General: He is not in acute distress.    Appearance: He is well-developed. He is not diaphoretic.  Eyes:     General: No scleral icterus.    Conjunctiva/sclera: Conjunctivae normal.  Neck:     Musculoskeletal: Neck supple.     Thyroid: No thyromegaly.  Cardiovascular:     Rate and Rhythm: Normal rate and regular rhythm.     Heart sounds: Normal heart sounds. No murmur.  Pulmonary:     Effort: Pulmonary effort is normal. No respiratory distress.     Breath sounds: Normal breath sounds. No wheezing.  Musculoskeletal: Normal range of motion.  Lymphadenopathy:     Cervical: No cervical adenopathy.  Skin:    General: Skin is warm and dry.     Findings: No rash.  Neurological:     Mental Status: He is alert and oriented to person, place, and time.     Coordination: Coordination normal.  Psychiatric:        Behavior: Behavior  normal.      Assessment & Plan:   Problem List Items Addressed This Visit      Genitourinary   BPH (benign prostatic hyperplasia)   Relevant Orders   PSA, total and free (Completed)     Other   Hyperlipidemia   Relevant Orders   Lipid panel (Completed)   Vitamin D deficiency   Relevant Orders   VITAMIN D 25 Hydroxy (Vit-D Deficiency, Fractures) (Completed)    Other Visit Diagnoses    Well adult exam    -  Primary   Relevant Orders   CBC with Differential/Platelet (Completed)   CMP14+EGFR (Completed)   Lipid panel (Completed)      Continue current medications and will check blood work today, no major changes because patient does try to stay healthy and will reassess after blood work and return in 1 year Follow up plan: Return in about 1 year (around 01/11/2020), or if symptoms worsen or fail to improve, for physical.   Counseling provided for all of the vaccine components Orders Placed This Encounter  Procedures  . CBC with Differential/Platelet  . CMP14+EGFR  . Lipid panel  . PSA, total and free  . VITAMIN D 25 Hydroxy (Vit-D Deficiency, Fractures)    Caryl Pina, MD [facility] 01/16/2019, 10:02 PM

## 2019-01-12 LAB — PSA, TOTAL AND FREE
PSA, Free Pct: 19.4 %
PSA, Free: 0.33 ng/mL
Prostate Specific Ag, Serum: 1.7 ng/mL (ref 0.0–4.0)

## 2019-01-12 LAB — CMP14+EGFR
ALT: 16 IU/L (ref 0–44)
AST: 18 IU/L (ref 0–40)
Albumin/Globulin Ratio: 1.8 (ref 1.2–2.2)
Albumin: 4.3 g/dL (ref 3.8–4.8)
Alkaline Phosphatase: 78 IU/L (ref 39–117)
BUN/Creatinine Ratio: 20 (ref 10–24)
BUN: 15 mg/dL (ref 8–27)
Bilirubin Total: 0.3 mg/dL (ref 0.0–1.2)
CO2: 26 mmol/L (ref 20–29)
Calcium: 9.3 mg/dL (ref 8.6–10.2)
Chloride: 103 mmol/L (ref 96–106)
Creatinine, Ser: 0.75 mg/dL — ABNORMAL LOW (ref 0.76–1.27)
GFR calc Af Amer: 108 mL/min/{1.73_m2} (ref >59)
GFR calc non Af Amer: 94 mL/min/{1.73_m2} (ref >59)
Globulin, Total: 2.4 g/dL (ref 1.5–4.5)
Glucose: 96 mg/dL (ref 65–99)
Potassium: 4.3 mmol/L (ref 3.5–5.2)
Sodium: 144 mmol/L (ref 134–144)
Total Protein: 6.7 g/dL (ref 6.0–8.5)

## 2019-01-12 LAB — CBC WITH DIFFERENTIAL/PLATELET
Basophils Absolute: 0 10*3/uL (ref 0.0–0.2)
Basos: 1 %
EOS (ABSOLUTE): 0.1 10*3/uL (ref 0.0–0.4)
Eos: 2 %
Hematocrit: 42.7 % (ref 37.5–51.0)
Hemoglobin: 14.4 g/dL (ref 13.0–17.7)
Immature Grans (Abs): 0 10*3/uL (ref 0.0–0.1)
Immature Granulocytes: 0 %
Lymphocytes Absolute: 2 10*3/uL (ref 0.7–3.1)
Lymphs: 35 %
MCH: 31.7 pg (ref 26.6–33.0)
MCHC: 33.7 g/dL (ref 31.5–35.7)
MCV: 94 fL (ref 79–97)
Monocytes Absolute: 0.6 10*3/uL (ref 0.1–0.9)
Monocytes: 11 %
Neutrophils Absolute: 3 10*3/uL (ref 1.4–7.0)
Neutrophils: 51 %
Platelets: 243 10*3/uL (ref 150–450)
RBC: 4.54 x10E6/uL (ref 4.14–5.80)
RDW: 12.3 % (ref 11.6–15.4)
WBC: 5.8 10*3/uL (ref 3.4–10.8)

## 2019-01-12 LAB — LIPID PANEL
Chol/HDL Ratio: 3.1 ratio (ref 0.0–5.0)
Cholesterol, Total: 181 mg/dL (ref 100–199)
HDL: 59 mg/dL (ref >39)
LDL Chol Calc (NIH): 106 mg/dL — ABNORMAL HIGH (ref 0–99)
Triglycerides: 85 mg/dL (ref 0–149)
VLDL Cholesterol Cal: 16 mg/dL (ref 5–40)

## 2019-01-12 LAB — VITAMIN D 25 HYDROXY (VIT D DEFICIENCY, FRACTURES): Vit D, 25-Hydroxy: 52.4 ng/mL (ref 30.0–100.0)

## 2019-05-04 ENCOUNTER — Encounter: Payer: Self-pay | Admitting: Family Medicine

## 2019-06-02 ENCOUNTER — Ambulatory Visit: Payer: Medicare Other | Attending: Internal Medicine

## 2019-06-02 ENCOUNTER — Other Ambulatory Visit: Payer: Self-pay | Admitting: *Deleted

## 2019-06-02 DIAGNOSIS — Z23 Encounter for immunization: Secondary | ICD-10-CM | POA: Insufficient documentation

## 2019-06-02 NOTE — Progress Notes (Signed)
   Covid-19 Vaccination Clinic  Name:  Matthew Vasquez    MRN: [id number] DOB: [birth year]  06/02/2019  [name] was observed post Covid-19 immunization for 15 minutes without incidence. He was provided with Vaccine Information Sheet and instruction to access the V-Safe system.   [name] was instructed to call 911 with any severe reactions post vaccine: Marland Kitchen Difficulty breathing  . Swelling of your face and throat  . A fast heartbeat  . A bad rash all over your body  . Dizziness and weakness    Immunizations Administered    Name Date Dose VIS Date Route   Pfizer COVID-19 Vaccine 06/02/2019  9:39 AM 0.3 mL 04/23/2019 Intramuscular   Manufacturer: [employer]   Lot: [id number]   [place]: [id number]

## 2019-06-15 ENCOUNTER — Encounter: Payer: Self-pay | Admitting: Internal Medicine

## 2019-06-21 ENCOUNTER — Ambulatory Visit: Payer: Medicare Other | Attending: Internal Medicine

## 2019-06-21 DIAGNOSIS — Z23 Encounter for immunization: Secondary | ICD-10-CM | POA: Insufficient documentation

## 2019-06-21 NOTE — Progress Notes (Signed)
   Covid-19 Vaccination Clinic  Name:  Matthew Vasquez    MRN: [id number] DOB: 1950-12-23  06/21/2019  Matthew Vasquez was observed post Covid-19 immunization for 15 minutes without incidence. He was provided with Vaccine Information Sheet and instruction to access the V-Safe system.   Matthew Vasquez was instructed to call 911 with any severe reactions post vaccine: Marland Kitchen Difficulty breathing  . Swelling of your face and throat  . A fast heartbeat  . A bad rash all over your body  . Dizziness and weakness    Immunizations Administered    Name Date Dose VIS Date Route   Pfizer COVID-19 Vaccine 06/21/2019 12:26 PM 0.3 mL 04/23/2019 Intramuscular   Manufacturer: [place]   Lot: [id number]   [place]: [id number]

## 2019-06-28 ENCOUNTER — Other Ambulatory Visit: Payer: Self-pay | Admitting: Family Medicine

## 2019-07-05 ENCOUNTER — Other Ambulatory Visit: Payer: Self-pay

## 2019-07-05 ENCOUNTER — Ambulatory Visit (AMBULATORY_SURGERY_CENTER): Payer: Self-pay | Admitting: *Deleted

## 2019-07-05 VITALS — Temp 96.0°F | Ht 71.0 in | Wt 166.8 lb

## 2019-07-05 DIAGNOSIS — Z1211 Encounter for screening for malignant neoplasm of colon: Secondary | ICD-10-CM

## 2019-07-05 MED ORDER — PLENVU 140 G PO SOLR: 1.0000 | ORAL | 0 refills | Status: DC

## 2019-07-05 NOTE — Progress Notes (Signed)
No egg or soy allergy known to patient  No issues with past sedation with any surgeries  or procedures, no intubation problems  No diet pills per patient No home 02 use per patient  No blood thinners per patient  Pt denies issues with constipation  No A fib or A flutter  EMMI video sent to pt's e mail   Plenvu sample due to insurance  Lot [id number] exp 08-2019  Due to the COVID-19 pandemic we are asking patients to follow these guidelines. Please only bring one care partner. Please be aware that your care partner may wait in the car in the parking lot or if they feel like they will be too hot to wait in the car, they may wait in the lobby on the 4th floor. All care partners are required to wear a mask the entire time (we do not have any that we can provide them), they need to practice social distancing, and we will do a Covid check for all patient's and care partners when you arrive. Also we will check their temperature and your temperature. If the care partner waits in their car they need to stay in the parking lot the entire time and we will call them on their cell phone when the patient is ready for discharge so they can bring the car to the front of the building. Also all patient's will need to wear a mask into building.  Pt completed Pfizer vaccine for covid 06-21-2019-

## 2019-07-14 ENCOUNTER — Encounter: Payer: Self-pay | Admitting: Internal Medicine

## 2019-07-19 ENCOUNTER — Other Ambulatory Visit: Payer: Self-pay

## 2019-07-19 ENCOUNTER — Ambulatory Visit (AMBULATORY_SURGERY_CENTER): Payer: Medicare Other | Admitting: Internal Medicine

## 2019-07-19 ENCOUNTER — Encounter: Payer: Self-pay | Admitting: Internal Medicine

## 2019-07-19 VITALS — BP 119/77 | HR 61 | Temp 96.9°F | Resp 12 | Ht 71.0 in | Wt 166.8 lb

## 2019-07-19 DIAGNOSIS — D125 Benign neoplasm of sigmoid colon: Secondary | ICD-10-CM

## 2019-07-19 DIAGNOSIS — Z1211 Encounter for screening for malignant neoplasm of colon: Secondary | ICD-10-CM

## 2019-07-19 MED ORDER — SODIUM CHLORIDE 0.9 % IV SOLN: 500.0000 mL | Freq: Once | INTRAVENOUS | Status: DC

## 2019-07-19 NOTE — Op Note (Signed)
[facility] Patient Name: Matthew Vasquez Procedure Date: 07/19/2019 12:26 PM MRN: [id number] Endoscopist: Docia Chuck. Henrene Pastor , MD Age: 69 Referring MD:  Date of Birth: Jul 29, 1950 Gender: Male Account #: [id number] Procedure:                Colonoscopy with cold snare polypectomy x 1 Indications:              Screening for colorectal malignant neoplasm.                            Negative index exam 2008 Medicines:                Monitored Anesthesia Care Procedure:                Pre-Anesthesia Assessment:                           - Prior to the procedure, a History and Physical                            was performed, and patient medications and                            allergies were reviewed. The patient's tolerance of                            previous anesthesia was also reviewed. The risks                            and benefits of the procedure and the sedation                            options and risks were discussed with the patient.                            All questions were answered, and informed consent                            was obtained. Prior Anticoagulants: The patient has                            taken no previous anticoagulant or antiplatelet                            agents. ASA Grade Assessment: II - A patient with                            mild systemic disease. After reviewing the risks                            and benefits, the patient was deemed in                            satisfactory condition to undergo the procedure.  After obtaining informed consent, the colonoscope                            was passed under direct vision. Throughout the                            procedure, the patient's blood pressure, pulse, and                            oxygen saturations were monitored continuously. The                            Colonoscope was introduced through the anus and                            advanced to  the the cecum, identified by                            appendiceal orifice and ileocecal valve. The                            ileocecal valve, appendiceal orifice, and rectum                            were photographed. The quality of the bowel                            preparation was excellent. The colonoscopy was                            performed without difficulty. The patient tolerated                            the procedure well. The bowel preparation used was                            SUPREP via split dose instruction. Scope In: 12:35:50 PM Scope Out: 12:47:28 PM Scope Withdrawal Time: 0 hours 6 minutes 56 seconds  Total Procedure Duration: 0 hours 11 minutes 38 seconds  Findings:                 A 4 mm polyp was found in the sigmoid colon. The                            polyp was removed with a cold snare. Resection and                            retrieval were complete.                           Two small angiodysplastic lesions were found in the                            cecum.  External and internal hemorrhoids were found during                            retroflexion.                           The exam was otherwise without abnormality on                            direct and retroflexion views. Complications:            No immediate complications. Estimated blood loss:                            None. Estimated Blood Loss:     Estimated blood loss: none. Impression:               - One 4 mm polyp in the sigmoid colon, removed with                            a cold snare. Resected and retrieved.                           - Two colonic angiodysplastic lesions.                           - External and internal hemorrhoids.                           - The examination was otherwise normal on direct                            and retroflexion views. Recommendation:           - Repeat colonoscopy in 7 years for surveillance if                             polyp adenomatous.                           - Patient has a contact number available for                            emergencies. The signs and symptoms of potential                            delayed complications were discussed with the                            patient. Return to normal activities tomorrow.                            Written discharge instructions were provided to the                            patient.                           -  Resume previous diet.                           - Continue present medications.                           - Await pathology results. Docia Chuck. Henrene Pastor, MD 07/19/2019 12:53:18 PM This report has been signed electronically.

## 2019-07-19 NOTE — Progress Notes (Signed)
Temp by LC, vitals by DT   Pt's states no medical or surgical changes since previsit or office visit.

## 2019-07-19 NOTE — Patient Instructions (Signed)
Handouts given for polyps and hemorrhoids. ? ?YOU HAD AN ENDOSCOPIC PROCEDURE TODAY AT THE [facility]:   Refer to the procedure report that was given to you for any specific questions about what was found during the examination.  If the procedure report does not answer your questions, please call your gastroenterologist to clarify.  If you requested that your care partner not be given the details of your procedure findings, then the procedure report has been included in a sealed envelope for you to review at your convenience later. ? ?YOU SHOULD EXPECT: Some feelings of bloating in the abdomen. Passage of more gas than usual.  Walking can help get rid of the air that was put into your GI tract during the procedure and reduce the bloating. If you had a lower endoscopy (such as a colonoscopy or flexible sigmoidoscopy) you may notice spotting of blood in your stool or on the toilet paper. If you underwent a bowel prep for your procedure, you may not have a normal bowel movement for a few days. ? ?Please Note:  You might notice some irritation and congestion in your nose or some drainage.  This is from the oxygen used during your procedure.  There is no need for concern and it should clear up in a day or so. ? ?SYMPTOMS TO REPORT IMMEDIATELY: ? ?Following lower endoscopy (colonoscopy or flexible sigmoidoscopy): ? Excessive amounts of blood in the stool ? Significant tenderness or worsening of abdominal pains ? Swelling of the abdomen that is new, acute ? Fever of 100?F or higher ? ?For urgent or emergent issues, a gastroenterologist can be reached at any hour by calling [phone]. ?Do not use MyChart messaging for urgent concerns.  ? ? ?DIET:  We do recommend a small meal at first, but then you may proceed to your regular diet.  Drink plenty of fluids but you should avoid alcoholic beverages for 24 hours. ? ?ACTIVITY:  You should plan to take it easy for the rest of today and you should NOT DRIVE or  use heavy machinery until tomorrow (because of the sedation medicines used during the test).   ? ?FOLLOW UP: ?Our staff will call the number listed on your records 48-72 hours following your procedure to check on you and address any questions or concerns that you may have regarding the information given to you following your procedure. If we do not reach you, we will leave a message.  We will attempt to reach you two times.  During this call, we will ask if you have developed any symptoms of COVID 19. If you develop any symptoms (ie: fever, flu-like symptoms, shortness of breath, cough etc.) before then, please call [phone].  If you test positive for Covid 19 in the 2 weeks post procedure, please call and report this information to us.   ? ?If any biopsies were taken you will be contacted by phone or by letter within the next 1-3 weeks.  Please call us at [phone] if you have not heard about the biopsies in 3 weeks.  ? ? ?SIGNATURES/CONFIDENTIALITY: ?You and/or your care partner have signed paperwork which will be entered into your electronic medical record.  These signatures attest to the fact that that the information above on your After Visit Summary has been reviewed and is understood.  Full responsibility of the confidentiality of this discharge information lies with you and/or your care-partner.  ?

## 2019-07-19 NOTE — Progress Notes (Signed)
PT taken to PACU. Monitors in place. VSS. Report given to RN. 

## 2019-07-21 ENCOUNTER — Encounter: Payer: Self-pay | Admitting: Internal Medicine

## 2019-07-21 ENCOUNTER — Telehealth: Payer: Self-pay

## 2019-07-21 NOTE — Telephone Encounter (Signed)
  Follow up Call-  Call back number 07/19/2019  Post procedure Call Back phone  # [phone]  Permission to leave phone message Yes  Some recent data might be hidden     Patient questions:  Do you have a fever, pain , or abdominal swelling? No. Pain Score  0 *  Have you tolerated food without any problems? Yes.    Have you been able to return to your normal activities? Yes.    Do you have any questions about your discharge instructions: Diet   No. Medications  No. Follow up visit  No.  Do you have questions or concerns about your Care? No.  Actions: * If pain score is 4 or above: No action needed, pain <4. 1. Have you developed a fever since your procedure? no  2.   Have you had an respiratory symptoms (SOB or cough) since your procedure? no  3.   Have you tested positive for COVID 19 since your procedure no  4.   Have you had any family members/close contacts diagnosed with the COVID 19 since your procedure?  no   If yes to any of these questions please route to Joylene John, RN and Alphonsa Gin, Therapist, sports.

## 2020-02-17 ENCOUNTER — Ambulatory Visit: Payer: Medicare Other | Attending: Internal Medicine

## 2020-02-17 DIAGNOSIS — Z23 Encounter for immunization: Secondary | ICD-10-CM

## 2020-02-17 NOTE — Progress Notes (Signed)
   Covid-19 Vaccination Clinic  Name:  Matthew Vasquez    MRN: [id number] DOB: Feb 25, 1951  02/17/2020  Mr. Warehime was observed post Covid-19 immunization for 15 minutes without incident. He was provided with Vaccine Information Sheet and instruction to access the V-Safe system.   Mr. Aspinall was instructed to call 911 with any severe reactions post vaccine: Marland Kitchen Difficulty breathing  . Swelling of face and throat  . A fast heartbeat  . A bad rash all over body  . Dizziness and weakness

## 2020-03-15 ENCOUNTER — Other Ambulatory Visit: Payer: Self-pay

## 2020-03-15 ENCOUNTER — Other Ambulatory Visit: Payer: Medicare Other

## 2020-03-15 ENCOUNTER — Other Ambulatory Visit: Payer: Self-pay | Admitting: Family Medicine

## 2020-03-15 DIAGNOSIS — E559 Vitamin D deficiency, unspecified: Secondary | ICD-10-CM

## 2020-03-15 DIAGNOSIS — N400 Enlarged prostate without lower urinary tract symptoms: Secondary | ICD-10-CM | POA: Diagnosis not present

## 2020-03-15 DIAGNOSIS — E7800 Pure hypercholesterolemia, unspecified: Secondary | ICD-10-CM

## 2020-03-15 DIAGNOSIS — Z8042 Family history of malignant neoplasm of prostate: Secondary | ICD-10-CM

## 2020-03-15 NOTE — Progress Notes (Signed)
placed lab orders for patient

## 2020-03-16 LAB — CBC WITH DIFFERENTIAL/PLATELET
Basophils Absolute: 0.1 10*3/uL (ref 0.0–0.2)
Basos: 1 %
EOS (ABSOLUTE): 0.2 10*3/uL (ref 0.0–0.4)
Eos: 4 %
Hematocrit: 43.2 % (ref 37.5–51.0)
Hemoglobin: 14.6 g/dL (ref 13.0–17.7)
Immature Grans (Abs): 0 10*3/uL (ref 0.0–0.1)
Immature Granulocytes: 0 %
Lymphocytes Absolute: 2 10*3/uL (ref 0.7–3.1)
Lymphs: 39 %
MCH: 31.9 pg (ref 26.6–33.0)
MCHC: 33.8 g/dL (ref 31.5–35.7)
MCV: 94 fL (ref 79–97)
Monocytes Absolute: 0.5 10*3/uL (ref 0.1–0.9)
Monocytes: 10 %
Neutrophils Absolute: 2.3 10*3/uL (ref 1.4–7.0)
Neutrophils: 46 %
Platelets: 223 10*3/uL (ref 150–450)
RBC: 4.58 x10E6/uL (ref 4.14–5.80)
RDW: 11.8 % (ref 11.6–15.4)
WBC: 5.1 10*3/uL (ref 3.4–10.8)

## 2020-03-16 LAB — CMP14+EGFR
ALT: 18 IU/L (ref 0–44)
AST: 26 IU/L (ref 0–40)
Albumin/Globulin Ratio: 1.4 (ref 1.2–2.2)
Albumin: 4.2 g/dL (ref 3.7–4.7)
Alkaline Phosphatase: 88 IU/L (ref 44–121)
BUN/Creatinine Ratio: 22 (ref 10–24)
BUN: 18 mg/dL (ref 8–27)
Bilirubin Total: 0.4 mg/dL (ref 0.0–1.2)
CO2: 26 mmol/L (ref 20–29)
Calcium: 9 mg/dL (ref 8.6–10.2)
Chloride: 103 mmol/L (ref 96–106)
Creatinine, Ser: 0.81 mg/dL (ref 0.76–1.27)
GFR calc Af Amer: 103 mL/min/{1.73_m2} (ref >59)
GFR calc non Af Amer: 89 mL/min/{1.73_m2} (ref >59)
Globulin, Total: 3 g/dL (ref 1.5–4.5)
Glucose: 92 mg/dL (ref 65–99)
Potassium: 4.3 mmol/L (ref 3.5–5.2)
Sodium: 139 mmol/L (ref 134–144)
Total Protein: 7.2 g/dL (ref 6.0–8.5)

## 2020-03-16 LAB — LIPID PANEL
Chol/HDL Ratio: 2.9 ratio (ref 0.0–5.0)
Cholesterol, Total: 191 mg/dL (ref 100–199)
HDL: 66 mg/dL (ref >39)
LDL Chol Calc (NIH): 116 mg/dL — ABNORMAL HIGH (ref 0–99)
Triglycerides: 49 mg/dL (ref 0–149)
VLDL Cholesterol Cal: 9 mg/dL (ref 5–40)

## 2020-03-16 LAB — PSA, TOTAL AND FREE
PSA, Free Pct: 16 %
PSA, Free: 0.32 ng/mL
Prostate Specific Ag, Serum: 2 ng/mL (ref 0.0–4.0)

## 2020-03-16 LAB — VITAMIN D 25 HYDROXY (VIT D DEFICIENCY, FRACTURES): Vit D, 25-Hydroxy: 45.1 ng/mL (ref 30.0–100.0)

## 2020-03-21 ENCOUNTER — Other Ambulatory Visit: Payer: Self-pay

## 2020-03-21 ENCOUNTER — Encounter: Payer: Self-pay | Admitting: Family Medicine

## 2020-03-21 ENCOUNTER — Ambulatory Visit (INDEPENDENT_AMBULATORY_CARE_PROVIDER_SITE_OTHER): Payer: Medicare Other | Admitting: Family Medicine

## 2020-03-21 VITALS — BP 110/67 | HR 70 | Temp 97.7°F | Ht 71.0 in | Wt 163.0 lb

## 2020-03-21 DIAGNOSIS — N400 Enlarged prostate without lower urinary tract symptoms: Secondary | ICD-10-CM

## 2020-03-21 DIAGNOSIS — E7800 Pure hypercholesterolemia, unspecified: Secondary | ICD-10-CM

## 2020-03-21 DIAGNOSIS — E559 Vitamin D deficiency, unspecified: Secondary | ICD-10-CM

## 2020-03-21 DIAGNOSIS — Z0000 Encounter for general adult medical examination without abnormal findings: Secondary | ICD-10-CM

## 2020-03-21 DIAGNOSIS — Z0001 Encounter for general adult medical examination with abnormal findings: Secondary | ICD-10-CM | POA: Diagnosis not present

## 2020-03-21 DIAGNOSIS — Z23 Encounter for immunization: Secondary | ICD-10-CM

## 2020-03-21 MED ORDER — TADALAFIL 10 MG PO TABS
10.0000 mg | ORAL_TABLET | Freq: Every day | ORAL | 3 refills | Status: DC | PRN
Start: 2020-03-21 — End: 2021-04-18

## 2020-03-21 NOTE — Progress Notes (Signed)
BP 110/67   Pulse 70   Temp 97.7 F (36.5 C)   Ht '5\' 11"'  (1.803 m)   Wt 163 lb (73.9 kg)   SpO2 96%   BMI 22.73 kg/m    Subjective:   Patient ID: Matthew Vasquez, male    DOB: 10-May-1951, 69 y.o.   MRN: [id number]  HPI: Matthew Vasquez is a 69 y.o. male presenting on 03/21/2020 for Medical Management of Chronic Issues (CPE)   HPI Patient is coming in today for adult well exam and physical and recheck of chronic issues. The only major issues that he complains of today is that his generic Viagra does cause flushing sometimes and would like to try the generic for Cialis and see if it is better.  He does say that he gets this tickle in the back of his throat where he feels like he cannot clear his throat sometimes and this is been going on for years and is not necessarily worse or better but just feels like it is there all the time.  He is taking an antihistamine already this time year and has been for the past couple weeks and has not made that much of a difference with it. Patient denies any chest pain, shortness of breath, headaches or vision issues, abdominal complaints, diarrhea, nausea, vomiting, or joint issues.   Relevant past medical, surgical, family and social history reviewed and updated as indicated. Interim medical history since our last visit reviewed. Allergies and medications reviewed and updated.  Review of Systems  Constitutional: Negative for chills and fever.  HENT: Negative for ear pain, tinnitus and trouble swallowing.   Respiratory: Negative for cough, shortness of breath and wheezing.   Cardiovascular: Negative for chest pain, palpitations and leg swelling.  Gastrointestinal: Negative for abdominal pain, blood in stool, constipation and diarrhea.  Genitourinary: Negative for difficulty urinating, dysuria and hematuria.  Musculoskeletal: Negative for back pain, gait problem and myalgias.  Skin: Negative for rash.  Neurological: Negative for dizziness, weakness  and headaches.  Psychiatric/Behavioral: Negative for suicidal ideas.  All other systems reviewed and are negative.   Per HPI unless specifically indicated above   Allergies as of 03/21/2020   No Known Allergies     Medication List       Accurate as of March 21, 2020 10:15 AM. If you have any questions, ask your nurse or doctor.        ANTIHISTAMINE PO Take by mouth.   cholecalciferol 1000 units tablet Commonly known as: VITAMIN D Take 1,000 Units by mouth daily.   Guaifenesin 1200 MG Tb12 Take by mouth in the morning and at bedtime.   sildenafil 20 MG tablet Commonly known as: REVATIO TAKE 2-5 TABLETS AS NEEDED PRIOR TO SEXUAL ACTIVITY        Objective:   BP 110/67   Pulse 70   Temp 97.7 F (36.5 C)   Ht '5\' 11"'  (1.803 m)   Wt 163 lb (73.9 kg)   SpO2 96%   BMI 22.73 kg/m   Wt Readings from Last 3 Encounters:  03/21/20 163 lb (73.9 kg)  07/19/19 166 lb 12.8 oz (75.7 kg)  07/05/19 166 lb 12.8 oz (75.7 kg)    Physical Exam Vitals and nursing note reviewed.  Constitutional:      General: He is not in acute distress.    Appearance: He is well-developed. He is not diaphoretic.  Eyes:     General: No scleral icterus.  Right eye: No discharge.     Conjunctiva/sclera: Conjunctivae normal.     Pupils: Pupils are equal, round, and reactive to light.  Neck:     Thyroid: No thyromegaly.  Cardiovascular:     Rate and Rhythm: Normal rate and regular rhythm.     Heart sounds: Normal heart sounds. No murmur heard.   Pulmonary:     Effort: Pulmonary effort is normal. No respiratory distress.     Breath sounds: Normal breath sounds. No wheezing.  Genitourinary:    Prostate: Enlarged. Not tender and no nodules present.     Rectum: External hemorrhoid present.  Musculoskeletal:        General: Normal range of motion.     Cervical back: Neck supple.  Lymphadenopathy:     Cervical: No cervical adenopathy.  Skin:    General: Skin is warm and dry.      Findings: No rash.  Neurological:     Mental Status: He is alert and oriented to person, place, and time.     Coordination: Coordination normal.  Psychiatric:        Behavior: Behavior normal.     Results for orders placed or performed in visit on 03/15/20  CBC with Differential/Platelet  Result Value Ref Range   WBC 5.1 3.4 - 10.8 x10E3/uL   RBC 4.58 4.14 - 5.80 x10E6/uL   Hemoglobin 14.6 13.0 - 17.7 g/dL   Hematocrit 43.2 37.5 - 51.0 %   MCV 94 79 - 97 fL   MCH 31.9 26.6 - 33.0 pg   MCHC 33.8 31 - 35 g/dL   RDW 11.8 11.6 - 15.4 %   Platelets 223 150 - 450 x10E3/uL   Neutrophils 46 Not Estab. %   Lymphs 39 Not Estab. %   Monocytes 10 Not Estab. %   Eos 4 Not Estab. %   Basos 1 Not Estab. %   Neutrophils Absolute 2.3 1.40 - 7.00 x10E3/uL   Lymphocytes Absolute 2.0 0 - 3 x10E3/uL   Monocytes Absolute 0.5 0 - 0 x10E3/uL   EOS (ABSOLUTE) 0.2 0.0 - 0.4 x10E3/uL   Basophils Absolute 0.1 0 - 0 x10E3/uL   Immature Granulocytes 0 Not Estab. %   Immature Grans (Abs) 0.0 0.0 - 0.1 x10E3/uL  CMP14+EGFR  Result Value Ref Range   Glucose 92 65 - 99 mg/dL   BUN 18 8 - 27 mg/dL   Creatinine, Ser 0.81 0.76 - 1.27 mg/dL   GFR calc non Af Amer 89 >59 mL/min/1.73   GFR calc Af Amer 103 >59 mL/min/1.73   BUN/Creatinine Ratio 22 10 - 24   Sodium 139 134 - 144 mmol/L   Potassium 4.3 3.5 - 5.2 mmol/L   Chloride 103 96 - 106 mmol/L   CO2 26 20 - 29 mmol/L   Calcium 9.0 8.6 - 10.2 mg/dL   Total Protein 7.2 6.0 - 8.5 g/dL   Albumin 4.2 3.7 - 4.7 g/dL   Globulin, Total 3.0 1.5 - 4.5 g/dL   Albumin/Globulin Ratio 1.4 1.2 - 2.2   Bilirubin Total 0.4 0.0 - 1.2 mg/dL   Alkaline Phosphatase 88 44 - 121 IU/L   AST 26 0 - 40 IU/L   ALT 18 0 - 44 IU/L  Lipid panel  Result Value Ref Range   Cholesterol, Total 191 100 - 199 mg/dL   Triglycerides 49 0 - 149 mg/dL   HDL 66 >39 mg/dL   VLDL Cholesterol Cal 9 5 - 40 mg/dL   LDL Chol Calc (  NIH) 116 (H) 0 - 99 mg/dL   Chol/HDL Ratio 2.9 0.0 - 5.0  ratio  PSA, total and free  Result Value Ref Range   Prostate Specific Ag, Serum 2.0 0.0 - 4.0 ng/mL   PSA, Free 0.32 N/A ng/mL   PSA, Free Pct 16.0 %  VITAMIN D 25 Hydroxy (Vit-D Deficiency, Fractures)  Result Value Ref Range   Vit D, 25-Hydroxy 45.1 30.0 - 100.0 ng/mL    Assessment & Plan:   Problem List Items Addressed This Visit      Genitourinary   BPH (benign prostatic hyperplasia)     Other   Hyperlipidemia   Vitamin D deficiency    Other Visit Diagnoses    Well adult exam    -  Primary   Need for diphtheria-tetanus-pertussis (Tdap) vaccine       Relevant Orders   Tdap vaccine greater than or equal to 7yo IM (Completed)      Blood work looks great, none no major changes, he is having some irritation and clearing his throat, he is already on an antihistamine so recommended Follow up plan: No follow-ups on file.  Counseling provided for all of the vaccine components Orders Placed This Encounter  Procedures  . Tdap vaccine greater than or equal to 7yo IM    Caryl Pina, MD [facility] 03/21/2020, 10:15 AM

## 2020-03-21 NOTE — Addendum Note (Signed)
Addended by: Caryl Pina on: 03/21/2020 10:40 AM   Modules accepted: Orders

## 2020-06-05 DIAGNOSIS — L918 Other hypertrophic disorders of the skin: Secondary | ICD-10-CM | POA: Diagnosis not present

## 2020-06-05 DIAGNOSIS — L821 Other seborrheic keratosis: Secondary | ICD-10-CM | POA: Diagnosis not present

## 2020-06-05 DIAGNOSIS — D2262 Melanocytic nevi of left upper limb, including shoulder: Secondary | ICD-10-CM | POA: Diagnosis not present

## 2020-06-05 DIAGNOSIS — D2261 Melanocytic nevi of right upper limb, including shoulder: Secondary | ICD-10-CM | POA: Diagnosis not present

## 2021-03-08 ENCOUNTER — Ambulatory Visit (INDEPENDENT_AMBULATORY_CARE_PROVIDER_SITE_OTHER): Payer: Medicare Other

## 2021-03-08 VITALS — Ht 71.0 in | Wt 163.0 lb

## 2021-03-08 DIAGNOSIS — Z0000 Encounter for general adult medical examination without abnormal findings: Secondary | ICD-10-CM

## 2021-03-08 NOTE — Patient Instructions (Addendum)
[name] , Thank you for taking time to come for your Medicare Wellness Visit. I appreciate your ongoing commitment to your health goals. Please review the following plan we discussed and let me know if I can assist you in the future.   Screening recommendations/referrals: Colonoscopy: Done 07/19/2019 - Repeat in 7 years Recommended yearly ophthalmology/optometry visit for glaucoma screening and checkup Recommended yearly dental visit for hygiene and checkup  Vaccinations: Influenza vaccine: Done 02/27/2021 - Repeat annually Pneumococcal vaccine: Done 09/27/2014 & 11/07/2016 Tdap vaccine: Done 03/21/2020 - Repeat in 10 years Shingles vaccine: Done 12/01/2017 & 02/10/2018   Covid-19: Done 06/02/2019, 06/21/2019, 02/17/2020, 09/05/2020, & 02/27/2021  Advanced directives: Please bring a copy of your health care power of attorney and living will to the office to be added to your chart at your convenience.   Conditions/risks identified: Keep up the great work!   Next appointment: Follow up in one year for your annual wellness visit.   Preventive Care 69 Years and Older, Male  Preventive care refers to lifestyle choices and visits with your health care provider that can promote health and wellness. What does preventive care include? A yearly physical exam. This is also called an annual well check. Dental exams once or twice a year. Routine eye exams. Ask your health care provider how often you should have your eyes checked. Personal lifestyle choices, including: Daily care of your teeth and gums. Regular physical activity. Eating a healthy diet. Avoiding tobacco and drug use. Limiting alcohol use. Practicing safe sex. Taking low doses of aspirin every day. Taking vitamin and mineral supplements as recommended by your health care provider. What happens during an annual well check? The services and screenings done by your health care provider during your annual well check will depend on your age,  overall health, lifestyle risk factors, and family history of disease. Counseling  Your health care provider may ask you questions about your: Alcohol use. Tobacco use. Drug use. Emotional well-being. Home and relationship well-being. Sexual activity. Eating habits. History of falls. Memory and ability to understand (cognition). Work and work Statistician. Screening  You may have the following tests or measurements: Height, weight, and BMI. Blood pressure. Lipid and cholesterol levels. These may be checked every 5 years, or more frequently if you are over 41 years old. Skin check. Lung cancer screening. You may have this screening every year starting at age 65 if you have a 30-pack-year history of smoking and currently smoke or have quit within the past 15 years. Fecal occult blood test (FOBT) of the stool. You may have this test every year starting at age 4. Flexible sigmoidoscopy or colonoscopy. You may have a sigmoidoscopy every 5 years or a colonoscopy every 10 years starting at age 57. Prostate cancer screening. Recommendations will vary depending on your family history and other risks. Hepatitis C blood test. Hepatitis B blood test. Sexually transmitted disease (STD) testing. Diabetes screening. This is done by checking your blood sugar (glucose) after you have not eaten for a while (fasting). You may have this done every 1-3 years. Abdominal aortic aneurysm (AAA) screening. You may need this if you are a current or former smoker. Osteoporosis. You may be screened starting at age 67 if you are at high risk. Talk with your health care provider about your test results, treatment options, and if necessary, the need for more tests. Vaccines  Your health care provider may recommend certain vaccines, such as: Influenza vaccine. This is recommended every year. Tetanus,  diphtheria, and acellular pertussis (Tdap, Td) vaccine. You may need a Td booster every 10 years. Zoster vaccine.  You may need this after age 12. Pneumococcal 13-valent conjugate (PCV13) vaccine. One dose is recommended after age 72. Pneumococcal polysaccharide (PPSV23) vaccine. One dose is recommended after age 46. Talk to your health care provider about which screenings and vaccines you need and how often you need them. This information is not intended to replace advice given to you by your health care provider. Make sure you discuss any questions you have with your health care provider. Document Released: 05/26/2015 Document Revised: 01/17/2016 Document Reviewed: 02/28/2015 Elsevier Interactive Patient Education  2017 [place] Prevention in the Home Falls can cause injuries. They can happen to people of all ages. There are many things you can do to make your home safe and to help prevent falls. What can I do on the outside of my home? Regularly fix the edges of walkways and driveways and fix any cracks. Remove anything that might make you trip as you walk through a door, such as a raised step or threshold. Trim any bushes or trees on the path to your home. Use bright outdoor lighting. Clear any walking paths of anything that might make someone trip, such as rocks or tools. Regularly check to see if handrails are loose or broken. Make sure that both sides of any steps have handrails. Any raised decks and porches should have guardrails on the edges. Have any leaves, snow, or ice cleared regularly. Use sand or salt on walking paths during winter. Clean up any spills in your garage right away. This includes oil or grease spills. What can I do in the bathroom? Use night lights. Install grab bars by the toilet and in the tub and shower. Do not use towel bars as grab bars. Use non-skid mats or decals in the tub or shower. If you need to sit down in the shower, use a plastic, non-slip stool. Keep the floor dry. Clean up any water that spills on the floor as soon as it happens. Remove soap  buildup in the tub or shower regularly. Attach bath mats securely with double-sided non-slip rug tape. Do not have throw rugs and other things on the floor that can make you trip. What can I do in the bedroom? Use night lights. Make sure that you have a light by your bed that is easy to reach. Do not use any sheets or blankets that are too big for your bed. They should not hang down onto the floor. Have a firm chair that has side arms. You can use this for support while you get dressed. Do not have throw rugs and other things on the floor that can make you trip. What can I do in the kitchen? Clean up any spills right away. Avoid walking on wet floors. Keep items that you use a lot in easy-to-reach places. If you need to reach something above you, use a strong step stool that has a grab bar. Keep electrical cords out of the way. Do not use floor polish or wax that makes floors slippery. If you must use wax, use non-skid floor wax. Do not have throw rugs and other things on the floor that can make you trip. What can I do with my stairs? Do not leave any items on the stairs. Make sure that there are handrails on both sides of the stairs and use them. Fix handrails that are broken or loose. Make  sure that handrails are as long as the stairways. Check any carpeting to make sure that it is firmly attached to the stairs. Fix any carpet that is loose or worn. Avoid having throw rugs at the top or bottom of the stairs. If you do have throw rugs, attach them to the floor with carpet tape. Make sure that you have a light switch at the top of the stairs and the bottom of the stairs. If you do not have them, ask someone to add them for you. What else can I do to help prevent falls? Wear shoes that: Do not have high heels. Have rubber bottoms. Are comfortable and fit you well. Are closed at the toe. Do not wear sandals. If you use a stepladder: Make sure that it is fully opened. Do not climb a closed  stepladder. Make sure that both sides of the stepladder are locked into place. Ask someone to hold it for you, if possible. Clearly mark and make sure that you can see: Any grab bars or handrails. First and last steps. Where the edge of each step is. Use tools that help you move around (mobility aids) if they are needed. These include: Canes. Walkers. Scooters. Crutches. Turn on the lights when you go into a dark area. Replace any light bulbs as soon as they burn out. Set up your furniture so you have a clear path. Avoid moving your furniture around. If any of your floors are uneven, fix them. If there are any pets around you, be aware of where they are. Review your medicines with your doctor. Some medicines can make you feel dizzy. This can increase your chance of falling. Ask your doctor what other things that you can do to help prevent falls. This information is not intended to replace advice given to you by your health care provider. Make sure you discuss any questions you have with your health care provider. Document Released: 02/23/2009 Document Revised: 10/05/2015 Document Reviewed: 06/03/2014 Elsevier Interactive Patient Education  2017 [employer]   Preventing High Cholesterol Cholesterol is a white, waxy substance similar to fat that the human body needs to help build cells. The liver makes all the cholesterol that a person's body needs. Having high cholesterol (hypercholesterolemia) increases your risk for heart disease and stroke. Extra or excess cholesterol comes from the food that you eat. High cholesterol can often be prevented with diet and lifestyle changes. If you already have high cholesterol, you can control it with diet, lifestyle changes, and medicines. How can high cholesterol affect me? If you have high cholesterol, fatty deposits (plaques) may build up on the walls of your blood vessels. The blood vessels that carry blood away from your heart are called arteries.  Plaques make the arteries narrower and stiffer. This in turn can: Restrict or block blood flow and cause blood clots to form. Increase your risk for heart attack and stroke. What can increase my risk for high cholesterol? This condition is more likely to develop in people who: Eat foods that are high in saturated fat or cholesterol. Saturated fat is mostly found in foods that come from animal sources. Are overweight. Are not getting enough exercise. Use products that contain nicotine or tobacco, such as cigarettes, e-cigarettes, and chewing tobacco. Have a family history of high cholesterol (familial hypercholesterolemia). What actions can I take to prevent this? Nutrition  Eat less saturated fat. Avoid trans fats (partially hydrogenated oils). These are often found in margarine and in some baked goods,  fried foods, and snacks bought in packages. Avoid precooked or cured meat, such as bacon, sausages, or meat loaves. Avoid foods and drinks that have added sugars. Eat more fruits, vegetables, and whole grains. Choose healthy sources of protein, such as fish, poultry, lean cuts of red meat, beans, peas, lentils, and nuts. Choose healthy sources of fat, such as: Nuts. Vegetable oils, especially olive oil. Fish that have healthy fats, such as omega-3 fatty acids. These fish include mackerel or salmon. Lifestyle Lose weight if you are overweight. Maintaining a healthy body mass index (BMI) can help prevent or control high cholesterol. It can also lower your risk for diabetes and high blood pressure. Ask your health care provider to help you with a diet and exercise plan to lose weight safely. Do not use any products that contain nicotine or tobacco. These products include cigarettes, chewing tobacco, and vaping devices, such as e-cigarettes. If you need help quitting, ask your health care provider. Alcohol use Do not drink alcohol if: Your health care provider tells you not to drink. You are  pregnant, may be pregnant, or are planning to become pregnant. If you drink alcohol: Limit how much you have to: 0-1 drink a day for women. 0-2 drinks a day for men. Know how much alcohol is in your drink. In the [place] one drink equals one 12 oz bottle of beer (355 mL), one 5 oz glass of wine (148 mL), or one 1 oz glass of hard liquor (44 mL). Activity  Get enough exercise. Do exercises as told by your health care provider. Each week, do at least 150 minutes of exercise that takes a medium level of effort (moderate-intensity exercise). This kind of exercise: Makes your heart beat faster while allowing you to still be able to talk. Can be done in short sessions several times a day or longer sessions a few times a week. For example, on 5 days each week, you could walk fast or ride your bike 3 times a day for 10 minutes each time. Medicines Your health care provider may recommend medicines to help lower cholesterol. This may be a medicine to lower the amount of cholesterol that your liver makes. You may need medicine if: Diet and lifestyle changes have not lowered your cholesterol enough. You have high cholesterol and other risk factors for heart disease or stroke. Take over-the-counter and prescription medicines only as told by your health care provider. General information Manage your risk factors for high cholesterol. Talk with your health care provider about all your risk factors and how to lower your risk. Manage other conditions that you have, such as diabetes or high blood pressure (hypertension). Have blood tests to check your cholesterol levels at regular points in time as told by your health care provider. Keep all follow-up visits. This is important. Where to find more information American Heart Association: www.heart.org National Heart, Lung, and Blood Institute: https://wilson-eaton.com/ Summary High cholesterol increases your risk for heart disease and stroke. By keeping your  cholesterol level low, you can reduce your risk for these conditions. High cholesterol can often be prevented with diet and lifestyle changes. Work with your health care provider to manage your risk factors, and have your blood tested regularly. This information is not intended to replace advice given to you by your health care provider. Make sure you discuss any questions you have with your health care provider. Document Revised: 07/03/2020 Document Reviewed: 07/03/2020 Elsevier Patient Education  2022 [employer].

## 2021-03-08 NOTE — Progress Notes (Signed)
Subjective:   Matthew Vasquez is a 70 y.o. male who presents for Medicare Annual/Subsequent preventive examination.  Virtual Visit via Telephone Note  I connected with  Matthew Vasquez on 03/08/21 at 11:15 AM EDT by telephone and verified that I am speaking with the correct person using two identifiers.  Location: Patient: Home Provider: [facility] Persons participating in the virtual visit: patient/Nurse Health Advisor   I discussed the limitations, risks, security and privacy concerns of performing an evaluation and management service by telephone and the availability of in person appointments. The patient expressed understanding and agreed to proceed.  Interactive audio and video telecommunications were attempted between this nurse and patient, however failed, due to patient having technical difficulties OR patient did not have access to video capability.  We continued and completed visit with audio only.  Some vital signs may be absent or patient reported.   Matthew Vasquez E Matthew Borba, LPN   Review of Systems     Cardiac Risk Factors include: advanced age (>40men, >25 women);male gender     Objective:    Today's Vitals   03/08/21 1122  Weight: 163 lb (73.9 kg)  Height: 5\' 11"  (1.803 m)   Body mass index is 22.73 kg/m.  Advanced Directives 03/08/2021 01/29/2018 01/27/2017  Does Patient Have a Medical Advance Directive? Yes Yes Yes  Type of Paramedic of [place];Living will Living will;Healthcare Power of [place];Living will  Does patient want to make changes to medical advance directive? - No - Patient declined No - Patient declined  Copy of [place] in Chart? No - copy requested No - copy requested No - copy requested    Current Medications (verified) Outpatient Encounter Medications as of 03/08/2021  Medication Sig   cholecalciferol (VITAMIN D) 1000 UNITS tablet Take 1,000 Units by mouth daily.   Guaifenesin  1200 MG TB12 Take by mouth in the morning and at bedtime.   tadalafil (CIALIS) 10 MG tablet Take 1-2 tablets (10-20 mg total) by mouth daily as needed for erectile dysfunction.   Triprolidine-Pseudoephedrine (ANTIHISTAMINE PO) Take by mouth.   No facility-administered encounter medications on file as of 03/08/2021.    Allergies (verified) Patient has no known allergies.   History: Past Medical History:  Diagnosis Date   Allergy    moderate per pt   Blood transfusion without reported diagnosis    age 30 yo    BPH (benign prostatic hyperplasia)    History of rectal bleeding    Hyperlipidemia    Past Surgical History:  Procedure Laterality Date   COLONOSCOPY     TONSILLECTOMY     Family History  Problem Relation Age of Onset   Heart disease Mother    Cancer Father        prostate and bone   Prostate cancer Father    Bone cancer Father    Epilepsy Brother    Heart disease Sister    Colon cancer Neg Hx    Colon polyps Neg Hx    Esophageal cancer Neg Hx    Rectal cancer Neg Hx    Stomach cancer Neg Hx    Social History   Socioeconomic History   Marital status: Married    Spouse name: Not on file   Number of children: Not on file   Years of education: Not on file   Highest education level: Not on file  Occupational History   Occupation: retired  Tobacco Use   Smoking  status: Never   Smokeless tobacco: Never  Vaping Use   Vaping Use: Never used  Substance and Sexual Activity   Alcohol use: Yes    Alcohol/week: 2.0 standard drinks    Types: 1 Glasses of wine, 1 Standard drinks or equivalent per week   Drug use: No   Sexual activity: Yes  Other Topics Concern   Not on file  Social History Narrative   Not on file   Social Determinants of Health   Financial Resource Strain: Low Risk    Difficulty of Paying Living Expenses: Not hard at all  Food Insecurity: No Food Insecurity   Worried About Charity fundraiser in the Last Year: Never true   [place]  in the Last Year: Never true  Transportation Needs: No Transportation Needs   Lack of Transportation (Medical): No   Lack of Transportation (Non-Medical): No  Physical Activity: Sufficiently Active   Days of Exercise per Week: 6 days   Minutes of Exercise per Session: 40 min  Stress: No Stress Concern Present   Feeling of Stress : Not at all  Social Connections: Socially Integrated   Frequency of Communication with Friends and Family: More than three times a week   Frequency of Social Gatherings with Friends and Family: More than three times a week   Attends Religious Services: More than 4 times per year   Active Member of [employer] or Organizations: Yes   Attends Archivist Meetings: 1 to 4 times per year   Marital Status: Married    Tobacco Counseling Counseling given: Not Answered   Clinical Intake:  Pre-visit preparation completed: Yes  Pain : No/denies pain     BMI - recorded: 22.73 Nutritional Status: BMI of 19-24  Normal Nutritional Risks: None Diabetes: No  How often do you need to have someone help you when you read instructions, pamphlets, or other written materials from your doctor or pharmacy?: 1 - Never  Diabetic? No  Interpreter Needed?: No  Information entered by :: Azreal Stthomas, LPN   Activities of Daily Living In your present state of health, do you have any difficulty performing the following activities: 03/08/2021  Hearing? N  Vision? N  Difficulty concentrating or making decisions? N  Walking or climbing stairs? N  Dressing or bathing? N  Doing errands, shopping? N  Preparing Food and eating ? N  Using the Toilet? N  In the past six months, have you accidently leaked urine? N  Do you have problems with loss of bowel control? N  Managing your Medications? N  Managing your Finances? N  Housekeeping or managing your Housekeeping? N  Some recent data might be hidden    Patient Care Team: Dettinger, Fransisca Kaufmann, MD as PCP - General  (Family Medicine) Marygrace Drought, MD as Consulting Physician (Ophthalmology)  Indicate any recent Medical Services you may have received from other than [facility] providers in the past year (date may be approximate).     Assessment:   This is a routine wellness examination for [facility].  Hearing/Vision screen Hearing Screening - Comments:: Denies hearing difficulties  Vision Screening - Comments:: Wears rx glasses - Up to date with annual eye exams at [facility] with Dr Satira Sark  Dietary issues and exercise activities discussed: Current Exercise Habits: Home exercise routine, Type of exercise: walking, Time (Minutes): 45, Frequency (Times/Week): 6, Weekly Exercise (Minutes/Week): 270, Intensity: Moderate, Exercise limited by: None identified   Goals Addressed  This Visit's Progress    Exercise 150 minutes per week (moderate activity)   On track      Depression Screen PHQ 2/9 Scores 03/08/2021 03/21/2020 01/11/2019 01/29/2018 11/10/2017 01/27/2017 11/07/2016  PHQ - 2 Score 0 0 0 0 0 0 0    Fall Risk Fall Risk  03/08/2021 03/21/2020 01/11/2019 01/29/2018 11/10/2017  Falls in the past year? 0 0 0 No No  Number falls in past yr: 0 - - - -  Injury with Fall? 0 - - - -  Risk for fall due to : No Fall Risks - - - -  Follow up Falls prevention discussed - - - -    FALL RISK PREVENTION PERTAINING TO THE HOME:  Any stairs in or around the home? Yes  If so, are there any without handrails? No  Home free of loose throw rugs in walkways, pet beds, electrical cords, etc? Yes  Adequate lighting in your home to reduce risk of falls? Yes   ASSISTIVE DEVICES UTILIZED TO PREVENT FALLS:  Life alert? No  Use of a cane, walker or w/c? No  Grab bars in the bathroom? No  Shower chair or bench in shower? Yes  Elevated toilet seat or a handicapped toilet? Yes   TIMED UP AND GO:  Was the test performed? No . Telephonic visit  Cognitive Function: Normal cognitive status assessed  by direct observation by this Nurse Health Advisor. No abnormalities found.    MMSE - Mini Mental State Exam 01/29/2018 01/27/2017  Orientation to time 5 5  Orientation to Place 5 5  Registration 3 3  Attention/ Calculation 5 5  Recall 3 2  Language- name 2 objects 2 2  Language- repeat 1 1  Language- follow 3 step command 3 3  Language- read & follow direction 1 1  Write a sentence 1 1  Copy design 1 1  Total score 30 29        Immunizations Immunization History  Administered Date(s) Administered   Fluad Quad(high Dose 65+) 02/27/2021   Influenza,inj,Quad PF,6+ Mos 03/25/2013, 03/22/2014, 02/23/2015   Influenza-Unspecified 02/22/2016, 02/10/2017, 02/10/2018, 01/29/2019, 02/17/2020   Moderna SARS-COV2 Booster Vaccination 02/27/2021   Moderna Sars-Covid-2 Vaccination 09/05/2020   PFIZER(Purple Top)SARS-COV-2 Vaccination 06/02/2019, 06/21/2019, 02/17/2020   Pneumococcal Conjugate-13 09/27/2014   Pneumococcal Polysaccharide-23 11/07/2016   Tdap 02/26/2010, 03/21/2020   Zoster Recombinat (Shingrix) 12/01/2017, 02/10/2018    TDAP status: Up to date  Flu Vaccine status: Up to date  Pneumococcal vaccine status: Up to date  Covid-19 vaccine status: Completed vaccines  Qualifies for Shingles Vaccine? Yes   Zostavax completed Yes   Shingrix Completed?: Yes  Screening Tests Health Maintenance  Topic Date Due   COVID-19 Vaccine (5 - Booster for Pfizer series) 04/24/2021   COLONOSCOPY (Pts 45-68yrs Insurance coverage will need to be confirmed)  07/19/2026   TETANUS/TDAP  03/21/2030   Pneumonia Vaccine 94+ Years old  Completed   INFLUENZA VACCINE  Completed   Hepatitis C Screening  Completed   Zoster Vaccines- Shingrix  Completed   HPV VACCINES  Aged Out    Health Maintenance  There are no preventive care reminders to display for this patient.  Colorectal cancer screening: Type of screening: Colonoscopy. Completed 07/19/2019. Repeat every 7 years  Lung Cancer Screening:  (Low Dose CT Chest recommended if Age 32-80 years, 30 pack-year currently smoking OR have quit w/in 15years.) does not qualify.  Additional Screening:  Hepatitis C Screening: does qualify; Completed 11/04/2016  Vision Screening: Recommended annual  ophthalmology exams for early detection of glaucoma and other disorders of the eye. Is the patient up to date with their annual eye exam?  Yes  Who is the provider or what is the name of the office in which the patient attends annual eye exams? [facility] If pt is not established with a provider, would they like to be referred to a provider to establish care? No .   Dental Screening: Recommended annual dental exams for proper oral hygiene  Community Resource Referral / Chronic Care Management: CRR required this visit?  No   CCM required this visit?  No      Plan:     I have personally reviewed and noted the following in the patient's chart:   Medical and social history Use of alcohol, tobacco or illicit drugs  Current medications and supplements including opioid prescriptions. Patient is not currently taking opioid prescriptions. Functional ability and status Nutritional status Physical activity Advanced directives List of other physicians Hospitalizations, surgeries, and ER visits in previous 12 months Vitals Screenings to include cognitive, depression, and falls Referrals and appointments  In addition, I have reviewed and discussed with patient certain preventive protocols, quality metrics, and best practice recommendations. A written personalized care plan for preventive services as well as general preventive health recommendations were provided to patient.     Sandrea Hammond, LPN   18/59/0931   Nurse Notes: None

## 2021-03-22 ENCOUNTER — Encounter: Payer: Medicare Other | Admitting: Family Medicine

## 2021-04-12 ENCOUNTER — Telehealth: Payer: Self-pay | Admitting: Family Medicine

## 2021-04-13 ENCOUNTER — Other Ambulatory Visit: Payer: Medicare Other

## 2021-04-13 DIAGNOSIS — R6889 Other general symptoms and signs: Secondary | ICD-10-CM | POA: Diagnosis not present

## 2021-04-13 DIAGNOSIS — Z0000 Encounter for general adult medical examination without abnormal findings: Secondary | ICD-10-CM

## 2021-04-13 NOTE — Telephone Encounter (Signed)
Orders in 

## 2021-04-13 NOTE — Telephone Encounter (Signed)
Pt would like to come in for lab work. Please add.

## 2021-04-14 LAB — CMP14+EGFR
ALT: 19 IU/L (ref 0–44)
AST: 24 IU/L (ref 0–40)
Albumin/Globulin Ratio: 1.4 (ref 1.2–2.2)
Albumin: 3.9 g/dL (ref 3.7–4.7)
Alkaline Phosphatase: 79 IU/L (ref 44–121)
BUN/Creatinine Ratio: 24 (ref 10–24)
BUN: 21 mg/dL (ref 8–27)
Bilirubin Total: 0.4 mg/dL (ref 0.0–1.2)
CO2: 24 mmol/L (ref 20–29)
Calcium: 9 mg/dL (ref 8.6–10.2)
Chloride: 105 mmol/L (ref 96–106)
Creatinine, Ser: 0.86 mg/dL (ref 0.76–1.27)
Globulin, Total: 2.7 g/dL (ref 1.5–4.5)
Glucose: 95 mg/dL (ref 70–99)
Potassium: 4.8 mmol/L (ref 3.5–5.2)
Sodium: 142 mmol/L (ref 134–144)
Total Protein: 6.6 g/dL (ref 6.0–8.5)
eGFR: 92 mL/min/{1.73_m2} (ref >59)

## 2021-04-14 LAB — CBC WITH DIFFERENTIAL/PLATELET
Basophils Absolute: 0 10*3/uL (ref 0.0–0.2)
Basos: 1 %
EOS (ABSOLUTE): 0.2 10*3/uL (ref 0.0–0.4)
Eos: 3 %
Hematocrit: 41.4 % (ref 37.5–51.0)
Hemoglobin: 14 g/dL (ref 13.0–17.7)
Immature Grans (Abs): 0 10*3/uL (ref 0.0–0.1)
Immature Granulocytes: 0 %
Lymphocytes Absolute: 1.9 10*3/uL (ref 0.7–3.1)
Lymphs: 37 %
MCH: 32 pg (ref 26.6–33.0)
MCHC: 33.8 g/dL (ref 31.5–35.7)
MCV: 95 fL (ref 79–97)
Monocytes Absolute: 0.5 10*3/uL (ref 0.1–0.9)
Monocytes: 11 %
Neutrophils Absolute: 2.4 10*3/uL (ref 1.4–7.0)
Neutrophils: 48 %
Platelets: 231 10*3/uL (ref 150–450)
RBC: 4.37 x10E6/uL (ref 4.14–5.80)
RDW: 12 % (ref 11.6–15.4)
WBC: 5.1 10*3/uL (ref 3.4–10.8)

## 2021-04-14 LAB — PSA, TOTAL AND FREE
PSA, Free Pct: 16.7 %
PSA, Free: 0.35 ng/mL
Prostate Specific Ag, Serum: 2.1 ng/mL (ref 0.0–4.0)

## 2021-04-14 LAB — LIPID PANEL
Chol/HDL Ratio: 3.1 ratio (ref 0.0–5.0)
Cholesterol, Total: 185 mg/dL (ref 100–199)
HDL: 59 mg/dL (ref >39)
LDL Chol Calc (NIH): 115 mg/dL — ABNORMAL HIGH (ref 0–99)
Triglycerides: 58 mg/dL (ref 0–149)
VLDL Cholesterol Cal: 11 mg/dL (ref 5–40)

## 2021-04-14 LAB — VITAMIN D 25 HYDROXY (VIT D DEFICIENCY, FRACTURES): Vit D, 25-Hydroxy: 47.2 ng/mL (ref 30.0–100.0)

## 2021-04-18 ENCOUNTER — Encounter: Payer: Self-pay | Admitting: Family Medicine

## 2021-04-18 ENCOUNTER — Other Ambulatory Visit: Payer: Self-pay

## 2021-04-18 ENCOUNTER — Ambulatory Visit (INDEPENDENT_AMBULATORY_CARE_PROVIDER_SITE_OTHER): Payer: Medicare Other | Admitting: Family Medicine

## 2021-04-18 VITALS — BP 117/65 | HR 72 | Ht 71.0 in | Wt 165.0 lb

## 2021-04-18 DIAGNOSIS — Z0001 Encounter for general adult medical examination with abnormal findings: Secondary | ICD-10-CM | POA: Diagnosis not present

## 2021-04-18 DIAGNOSIS — N400 Enlarged prostate without lower urinary tract symptoms: Secondary | ICD-10-CM

## 2021-04-18 DIAGNOSIS — Z0000 Encounter for general adult medical examination without abnormal findings: Secondary | ICD-10-CM

## 2021-04-18 MED ORDER — TADALAFIL 10 MG PO TABS: 10.0000 mg | ORAL_TABLET | Freq: Every day | ORAL | 3 refills | Status: DC | PRN

## 2021-04-18 NOTE — Progress Notes (Signed)
BP 117/65   Pulse 72   Ht '5\' 11"'  (1.803 m)   Wt 165 lb (74.8 kg)   SpO2 97%   BMI 23.01 kg/m    Subjective:   Patient ID: Matthew Vasquez, male    DOB: May 05, 1951, 70 y.o.   MRN: [id number]  HPI: Matthew Vasquez is a 70 y.o. male presenting on 04/18/2021 for Medical Management of Chronic Issues (CPE)   HPI Adult well exam Patient denies any chest pain, shortness of breath, headaches or vision issues, abdominal complaints, diarrhea, nausea, vomiting, or joint issues. Patient gets chest indigestion and congestion at night over the past few months.   Relevant past medical, surgical, family and social history reviewed and updated as indicated. Interim medical history since our last visit reviewed. Allergies and medications reviewed and updated.  Review of Systems  Constitutional:  Negative for chills and fever.  HENT:  Negative for ear pain and tinnitus.   Eyes:  Negative for pain and visual disturbance.  Respiratory:  Negative for cough, shortness of breath and wheezing.   Cardiovascular:  Negative for chest pain, palpitations and leg swelling.  Gastrointestinal:  Negative for abdominal pain, blood in stool, constipation and diarrhea.  Genitourinary:  Negative for dysuria and hematuria.  Musculoskeletal:  Negative for back pain, gait problem and myalgias.  Skin:  Negative for rash.  Neurological:  Negative for dizziness, weakness and headaches.  Psychiatric/Behavioral:  Negative for suicidal ideas.   All other systems reviewed and are negative.  Per HPI unless specifically indicated above   Allergies as of 04/18/2021   No Known Allergies      Medication List        Accurate as of April 18, 2021 11:04 AM. If you have any questions, ask your nurse or doctor.          ANTIHISTAMINE PO Take by mouth.   cholecalciferol 1000 units tablet Commonly known as: VITAMIN D Take 1,000 Units by mouth daily.   Guaifenesin 1200 MG Tb12 Take by mouth in the morning and at  bedtime.   tadalafil 10 MG tablet Commonly known as: Cialis Take 1-2 tablets (10-20 mg total) by mouth daily as needed for erectile dysfunction.         Objective:   BP 117/65   Pulse 72   Ht '5\' 11"'  (1.803 m)   Wt 165 lb (74.8 kg)   SpO2 97%   BMI 23.01 kg/m   Wt Readings from Last 3 Encounters:  04/18/21 165 lb (74.8 kg)  03/08/21 163 lb (73.9 kg)  03/21/20 163 lb (73.9 kg)    Physical Exam Vitals reviewed.  Constitutional:      General: He is not in acute distress.    Appearance: He is well-developed. He is not diaphoretic.  HENT:     Right Ear: External ear normal.     Left Ear: External ear normal.     Nose: Nose normal.     Mouth/Throat:     Pharynx: No oropharyngeal exudate.  Eyes:     General: No scleral icterus.       Right eye: No discharge.     Conjunctiva/sclera: Conjunctivae normal.     Pupils: Pupils are equal, round, and reactive to light.  Neck:     Thyroid: No thyromegaly.  Cardiovascular:     Rate and Rhythm: Normal rate and regular rhythm.     Heart sounds: Normal heart sounds. No murmur heard. Pulmonary:     Effort: Pulmonary effort  is normal. No respiratory distress.     Breath sounds: Normal breath sounds. No wheezing.  Abdominal:     General: Bowel sounds are normal. There is no distension.     Palpations: Abdomen is soft.     Tenderness: There is no abdominal tenderness. There is no guarding or rebound.  Musculoskeletal:        General: Normal range of motion.     Cervical back: Neck supple.  Lymphadenopathy:     Cervical: No cervical adenopathy.  Skin:    General: Skin is warm and dry.     Findings: No rash.  Neurological:     Mental Status: He is alert and oriented to person, place, and time.     Coordination: Coordination normal.  Psychiatric:        Behavior: Behavior normal.    Results for orders placed or performed in visit on 04/13/21  CBC with Differential/Platelet  Result Value Ref Range   WBC 5.1 3.4 - 10.8  x10E3/uL   RBC 4.37 4.14 - 5.80 x10E6/uL   Hemoglobin 14.0 13.0 - 17.7 g/dL   Hematocrit 41.4 37.5 - 51.0 %   MCV 95 79 - 97 fL   MCH 32.0 26.6 - 33.0 pg   MCHC 33.8 31.5 - 35.7 g/dL   RDW 12.0 11.6 - 15.4 %   Platelets 231 150 - 450 x10E3/uL   Neutrophils 48 Not Estab. %   Lymphs 37 Not Estab. %   Monocytes 11 Not Estab. %   Eos 3 Not Estab. %   Basos 1 Not Estab. %   Neutrophils Absolute 2.4 1.4 - 7.0 x10E3/uL   Lymphocytes Absolute 1.9 0.7 - 3.1 x10E3/uL   Monocytes Absolute 0.5 0.1 - 0.9 x10E3/uL   EOS (ABSOLUTE) 0.2 0.0 - 0.4 x10E3/uL   Basophils Absolute 0.0 0.0 - 0.2 x10E3/uL   Immature Granulocytes 0 Not Estab. %   Immature Grans (Abs) 0.0 0.0 - 0.1 x10E3/uL  CMP14+EGFR  Result Value Ref Range   Glucose 95 70 - 99 mg/dL   BUN 21 8 - 27 mg/dL   Creatinine, Ser 0.86 0.76 - 1.27 mg/dL   eGFR 92 >59 mL/min/1.73   BUN/Creatinine Ratio 24 10 - 24   Sodium 142 134 - 144 mmol/L   Potassium 4.8 3.5 - 5.2 mmol/L   Chloride 105 96 - 106 mmol/L   CO2 24 20 - 29 mmol/L   Calcium 9.0 8.6 - 10.2 mg/dL   Total Protein 6.6 6.0 - 8.5 g/dL   Albumin 3.9 3.7 - 4.7 g/dL   Globulin, Total 2.7 1.5 - 4.5 g/dL   Albumin/Globulin Ratio 1.4 1.2 - 2.2   Bilirubin Total 0.4 0.0 - 1.2 mg/dL   Alkaline Phosphatase 79 44 - 121 IU/L   AST 24 0 - 40 IU/L   ALT 19 0 - 44 IU/L  Lipid panel  Result Value Ref Range   Cholesterol, Total 185 100 - 199 mg/dL   Triglycerides 58 0 - 149 mg/dL   HDL 59 >39 mg/dL   VLDL Cholesterol Cal 11 5 - 40 mg/dL   LDL Chol Calc (NIH) 115 (H) 0 - 99 mg/dL   Chol/HDL Ratio 3.1 0.0 - 5.0 ratio  VITAMIN D 25 Hydroxy (Vit-D Deficiency, Fractures)  Result Value Ref Range   Vit D, 25-Hydroxy 47.2 30.0 - 100.0 ng/mL  PSA, total and free  Result Value Ref Range   Prostate Specific Ag, Serum 2.1 0.0 - 4.0 ng/mL   PSA, Free 0.35  N/A ng/mL   PSA, Free Pct 16.7 %    Assessment & Plan:   Problem List Items Addressed This Visit       Genitourinary   BPH (benign  prostatic hyperplasia)   Other Visit Diagnoses     Well adult exam    -  Primary       Continue current medications, add Prilosec for 2 months to see if it helps with his chest congestion and nasal congestion that he gets at night. Follow up plan: Return in about 1 year (around 04/18/2022), or if symptoms worsen or fail to improve, for Physical exam.  Counseling provided for all of the vaccine components No orders of the defined types were placed in this encounter.   Caryl Pina, MD [facility] 04/18/2021, 11:04 AM

## 2021-06-05 DIAGNOSIS — H52203 Unspecified astigmatism, bilateral: Secondary | ICD-10-CM | POA: Diagnosis not present

## 2021-06-07 DIAGNOSIS — H524 Presbyopia: Secondary | ICD-10-CM | POA: Diagnosis not present

## 2022-05-20 ENCOUNTER — Telehealth: Payer: Self-pay | Admitting: Family Medicine

## 2022-05-20 DIAGNOSIS — Z0000 Encounter for general adult medical examination without abnormal findings: Secondary | ICD-10-CM

## 2022-05-20 NOTE — Telephone Encounter (Signed)
Future labs ordered.  

## 2022-06-03 ENCOUNTER — Ambulatory Visit (INDEPENDENT_AMBULATORY_CARE_PROVIDER_SITE_OTHER): Payer: Medicare Other

## 2022-06-03 VITALS — Ht 71.0 in | Wt 163.0 lb

## 2022-06-03 DIAGNOSIS — Z0000 Encounter for general adult medical examination without abnormal findings: Secondary | ICD-10-CM | POA: Diagnosis not present

## 2022-06-03 NOTE — Progress Notes (Signed)
Subjective:   Matthew Vasquez is a 72 y.o. male who presents for Medicare Annual/Subsequent preventive examination. I connected with  [name] on 06/03/22 by a audio enabled telemedicine application and verified that I am speaking with the correct person using two identifiers.  Patient Location: Home  Provider Location: Home Office  I discussed the limitations of evaluation and management by telemedicine. The patient expressed understanding and agreed to proceed.  Review of Systems     Cardiac Risk Factors include: advanced age (>15mn, >>78women);male gender     Objective:    Today's Vitals   06/03/22 0853  Weight: 163 lb (73.9 kg)  Height: '5\' 11"'$  (1.803 m)   Body mass index is 22.73 kg/m.     06/03/2022    8:56 AM 03/08/2021   11:25 AM 01/29/2018   10:46 AM 01/27/2017    9:40 AM  Advanced Directives  Does Patient Have a Medical Advance Directive? Yes Yes Yes Yes  Type of AParamed[place]of [place] Living will;Healthcare Power of [place] will  Does patient want to make changes to medical advance directive?   No - Patient declined No - Patient declined  Copy of HArmstrongin Chart? No - copy requested No - copy requested No - copy requested No - copy requested    Current Medications (verified) Outpatient Encounter Medications as of 06/03/2022  Medication Sig   cholecalciferol (VITAMIN D) 1000 UNITS tablet Take 1,000 Units by mouth daily.   Guaifenesin 1200 MG TB12 Take by mouth in the morning and at bedtime.   tadalafil (CIALIS) 10 MG tablet Take 1-2 tablets (10-20 mg total) by mouth daily as needed for erectile dysfunction.   Triprolidine-Pseudoephedrine (ANTIHISTAMINE PO) Take by mouth.   No facility-administered encounter medications on file as of 06/03/2022.    Allergies (verified) Patient has no known allergies.   History: Past  Medical History:  Diagnosis Date   Allergy    moderate per pt   Blood transfusion without reported diagnosis    age 72yo    BPH (benign prostatic hyperplasia)    History of rectal bleeding    Hyperlipidemia    Past Surgical History:  Procedure Laterality Date   COLONOSCOPY     TONSILLECTOMY     Family History  Problem Relation Age of Onset   Heart disease Mother    Cancer Father        prostate and bone   Prostate cancer Father    Bone cancer Father    Epilepsy Brother    Heart disease Sister    Colon cancer Neg Hx    Colon polyps Neg Hx    Esophageal cancer Neg Hx    Rectal cancer Neg Hx    Stomach cancer Neg Hx    Social History   Socioeconomic History   Marital status: Married    Spouse name: Not on file   Number of children: Not on file   Years of education: Not on file   Highest education level: Not on file  Occupational History   Occupation: retired  Tobacco Use   Smoking status: Never   Smokeless tobacco: Never  Vaping Use   Vaping Use: Never used  Substance and Sexual Activity   Alcohol use: Yes    Alcohol/week: 2.0 standard drinks of alcohol    Types: 1 Glasses of wine, 1 Standard drinks or equivalent per week  Drug use: No   Sexual activity: Yes  Other Topics Concern   Not on file  Social History Narrative   Not on file   Social Determinants of Health   Financial Resource Strain: Low Risk  (06/03/2022)   Overall Financial Resource Strain (CARDIA)    Difficulty of Paying Living Expenses: Not hard at all  Food Insecurity: No Food Insecurity (06/03/2022)   Hunger Vital Sign    Worried About Running Out of Food in the Last Year: Never true    Ran Out of Food in the Last Year: Never true  Transportation Needs: No Transportation Needs (06/03/2022)   PRAPARE - Hydrologist (Medical): No    Lack of Transportation (Non-Medical): No  Physical Activity: Sufficiently Active (06/03/2022)   Exercise Vital Sign    Days of  Exercise per Week: 6 days    Minutes of Exercise per Session: 60 min  Stress: No Stress Concern Present (06/03/2022)   [place]    Feeling of Stress : Not at all  Social Connections: [place] (06/03/2022)   Social Connection and Isolation Panel [NHANES]    Frequency of Communication with Friends and Family: More than three times a week    Frequency of Social Gatherings with Friends and Family: More than three times a week    Attends Religious Services: More than 4 times per year    Active Member of [employer] or Organizations: Yes    Attends Music therapist: More than 4 times per year    Marital Status: Married    Tobacco Counseling Counseling given: Not Answered   Clinical Intake:  Pre-visit preparation completed: Yes  Pain : No/denies pain     Nutritional Risks: None Diabetes: No  How often do you need to have someone help you when you read instructions, pamphlets, or other written materials from your doctor or pharmacy?: 1 - Never  Diabetic?no   Interpreter Needed?: No  Information entered by :: Jadene Pierini, LPN   Activities of Daily Living    06/03/2022    8:56 AM  In your present state of health, do you have any difficulty performing the following activities:  Hearing? 0  Vision? 0  Difficulty concentrating or making decisions? 0  Walking or climbing stairs? 0  Dressing or bathing? 0  Doing errands, shopping? 0  Preparing Food and eating ? N  Using the Toilet? N  In the past six months, have you accidently leaked urine? N  Do you have problems with loss of bowel control? N  Managing your Medications? N  Managing your Finances? N  Housekeeping or managing your Housekeeping? N    Patient Care Team: Dettinger, Fransisca Kaufmann, MD as PCP - General (Family Medicine) Marygrace Drought, MD as Consulting Physician (Ophthalmology)  Indicate any recent Medical Services you may have  received from other than [facility] providers in the past year (date may be approximate).     Assessment:   This is a routine wellness examination for [facility].  Hearing/Vision screen Vision Screening - Comments:: Wears rx glasses - up to date with routine eye exams with  Dr.Gould   Dietary issues and exercise activities discussed: Current Exercise Habits: Home exercise routine, Type of exercise: walking;strength training/weights, Time (Minutes): 60, Frequency (Times/Week): 5, Weekly Exercise (Minutes/Week): 300, Intensity: Mild, Exercise limited by: None identified   Goals Addressed             This  Visit's Progress    Exercise 150 minutes per week (moderate activity)   On track      Depression Screen    06/03/2022    8:55 AM 04/18/2021   10:02 AM 03/08/2021   11:37 AM 03/21/2020   10:03 AM 01/11/2019   10:05 AM 01/29/2018   10:46 AM 11/10/2017   10:22 AM  PHQ 2/9 Scores  PHQ - 2 Score 0 0 0 0 0 0 0    Fall Risk    06/03/2022    8:53 AM 04/18/2021   10:02 AM 03/08/2021   11:34 AM 03/21/2020   10:03 AM 01/11/2019   10:05 AM  Fall Risk   Falls in the past year? 0 0 0 0 0  Number falls in past yr: 0  0    Injury with Fall? 0  0    Risk for fall due to : No Fall Risks  No Fall Risks    Follow up Falls prevention discussed  Falls prevention discussed      FALL RISK PREVENTION PERTAINING TO THE HOME:  Any stairs in or around the home? Yes  If so, are there any without handrails? No  Home free of loose throw rugs in walkways, pet beds, electrical cords, etc? Yes  Adequate lighting in your home to reduce risk of falls? Yes   ASSISTIVE DEVICES UTILIZED TO PREVENT FALLS:  Life alert? No  Use of a cane, walker or w/c? No  Grab bars in the bathroom? No  Shower chair or bench in shower? Yes  Elevated toilet seat or a handicapped toilet? No       01/29/2018   10:54 AM 01/27/2017    9:45 AM  MMSE - Mini Mental State Exam  Orientation to time 5 5  Orientation to Place 5 5   Registration 3 3  Attention/ Calculation 5 5  Recall 3 2  Language- name 2 objects 2 2  Language- repeat 1 1  Language- follow 3 step command 3 3  Language- read & follow direction 1 1  Write a sentence 1 1  Copy design 1 1  Total score 30 29        06/03/2022    8:57 AM  6CIT Screen  What Year? 0 points  What month? 0 points  What time? 0 points  Count back from 20 0 points  Months in reverse 0 points  Repeat phrase 0 points  Total Score 0 points    Immunizations Immunization History  Administered Date(s) Administered   COVID-19, mRNA, vaccine(Comirnaty)12 years and older 03/07/2022   Fluad Quad(high Dose 65+) 02/27/2021, 02/19/2022   Influenza,inj,Quad PF,6+ Mos 03/25/2013, 03/22/2014, 02/23/2015   Influenza-Unspecified 02/22/2016, 02/10/2017, 02/10/2018, 01/29/2019, 02/17/2020   Moderna SARS-COV2 Booster Vaccination 02/27/2021   Moderna Sars-Covid-2 Vaccination 09/05/2020   PFIZER(Purple Top)SARS-COV-2 Vaccination 06/02/2019, 06/21/2019, 02/17/2020   Pneumococcal Conjugate-13 09/27/2014   Pneumococcal Polysaccharide-23 11/07/2016   Tdap 02/26/2010, 03/21/2020   Zoster Recombinat (Shingrix) 12/01/2017, 02/10/2018    TDAP status: Up to date  Flu Vaccine status: Up to date  Pneumococcal vaccine status: Up to date  Covid-19 vaccine status: Completed vaccines  Qualifies for Shingles Vaccine? Yes   Zostavax completed Yes   Shingrix Completed?: Yes  Screening Tests Health Maintenance  Topic Date Due   Medicare Annual Wellness (AWV)  06/04/2023   COLONOSCOPY (Pts 45-59yr Insurance coverage will need to be confirmed)  07/19/2026   DTaP/Tdap/Td (3 - Td or Tdap) 03/21/2030   Pneumonia Vaccine 74 Years old  Completed   INFLUENZA VACCINE  Completed   COVID-19 Vaccine  Completed   Hepatitis C Screening  Completed   Zoster Vaccines- Shingrix  Completed   HPV VACCINES  Aged Out    Health Maintenance  There are no preventive care reminders to display for this  patient.   Colorectal cancer screening: Type of screening: Colonoscopy. Completed 07/19/2019. Repeat every 7 years  Lung Cancer Screening: (Low Dose CT Chest recommended if Age 38-80 years, 30 pack-year currently smoking OR have quit w/in 15years.) does not qualify.   Lung Cancer Screening Referral: n/a  Additional Screening:  Hepatitis C Screening: does not qualify; Completed 11/04/2016  Vision Screening: Recommended annual ophthalmology exams for early detection of glaucoma and other disorders of the eye. Is the patient up to date with their annual eye exam?  Yes  Who is the provider or what is the name of the office in which the patient attends annual eye exams? Dr.gould  If pt is not established with a provider, would they like to be referred to a provider to establish care? No .   Dental Screening: Recommended annual dental exams for proper oral hygiene  Community Resource Referral / Chronic Care Management: CRR required this visit?  No   CCM required this visit?  No      Plan:     I have personally reviewed and noted the following in the patient's chart:   Medical and social history Use of alcohol, tobacco or illicit drugs  Current medications and supplements including opioid prescriptions. Patient is not currently taking opioid prescriptions. Functional ability and status Nutritional status Physical activity Advanced directives List of other physicians Hospitalizations, surgeries, and ER visits in previous 12 months Vitals Screenings to include cognitive, depression, and falls Referrals and appointments  In addition, I have reviewed and discussed with patient certain preventive protocols, quality metrics, and best practice recommendations. A written personalized care plan for preventive services as well as general preventive health recommendations were provided to patient.     Daphane Shepherd, LPN   6/76/1952   Nurse Notes: none

## 2022-06-03 NOTE — Patient Instructions (Signed)
[name] , Thank you for taking time to come for your Medicare Wellness Visit. I appreciate your ongoing commitment to your health goals. Please review the following plan we discussed and let me know if I can assist you in the future.   These are the goals we discussed:  Goals      Exercise 150 minutes per week (moderate activity)        This is a list of the screening recommended for you and due dates:  Health Maintenance  Topic Date Due   Medicare Annual Wellness Visit  06/04/2023   Colon Cancer Screening  07/19/2026   DTaP/Tdap/Td vaccine (3 - Td or Tdap) 03/21/2030   Pneumonia Vaccine  Completed   Flu Shot  Completed   COVID-19 Vaccine  Completed   Hepatitis C Screening: USPSTF Recommendation to screen - Ages 74-80 yo.  Completed   Zoster (Shingles) Vaccine  Completed   HPV Vaccine  Aged Out    Advanced directives: Please bring a copy of your health care power of attorney and living will to the office to be added to your chart at your convenience.   Conditions/risks identified: Aim for 30 minutes of exercise or brisk walking, 6-8 glasses of water, and 5 servings of fruits and vegetables each day.   Next appointment: Follow up in one year for your annual wellness visit.   Preventive Care 74 Years and Older, Male  Preventive care refers to lifestyle choices and visits with your health care provider that can promote health and wellness. What does preventive care include? A yearly physical exam. This is also called an annual well check. Dental exams once or twice a year. Routine eye exams. Ask your health care provider how often you should have your eyes checked. Personal lifestyle choices, including: Daily care of your teeth and gums. Regular physical activity. Eating a healthy diet. Avoiding tobacco and drug use. Limiting alcohol use. Practicing safe sex. Taking low doses of aspirin every day. Taking vitamin and mineral supplements as recommended by your health care  provider. What happens during an annual well check? The services and screenings done by your health care provider during your annual well check will depend on your age, overall health, lifestyle risk factors, and family history of disease. Counseling  Your health care provider may ask you questions about your: Alcohol use. Tobacco use. Drug use. Emotional well-being. Home and relationship well-being. Sexual activity. Eating habits. History of falls. Memory and ability to understand (cognition). Work and work Statistician. Screening  You may have the following tests or measurements: Height, weight, and BMI. Blood pressure. Lipid and cholesterol levels. These may be checked every 5 years, or more frequently if you are over 74 years old. Skin check. Lung cancer screening. You may have this screening every year starting at age 74 if you have a 30-pack-year history of smoking and currently smoke or have quit within the past 15 years. Fecal occult blood test (FOBT) of the stool. You may have this test every year starting at age 74. Flexible sigmoidoscopy or colonoscopy. You may have a sigmoidoscopy every 5 years or a colonoscopy every 10 years starting at age 74. Prostate cancer screening. Recommendations will vary depending on your family history and other risks. Hepatitis C blood test. Hepatitis B blood test. Sexually transmitted disease (STD) testing. Diabetes screening. This is done by checking your blood sugar (glucose) after you have not eaten for a while (fasting). You may have this done every 1-3 years. Abdominal  aortic aneurysm (AAA) screening. You may need this if you are a current or former smoker. Osteoporosis. You may be screened starting at age 74 if you are at high risk. Talk with your health care provider about your test results, treatment options, and if necessary, the need for more tests. Vaccines  Your health care provider may recommend certain vaccines, such  as: Influenza vaccine. This is recommended every year. Tetanus, diphtheria, and acellular pertussis (Tdap, Td) vaccine. You may need a Td booster every 10 years. Zoster vaccine. You may need this after age 40. Pneumococcal 13-valent conjugate (PCV13) vaccine. One dose is recommended after age 74. Pneumococcal polysaccharide (PPSV23) vaccine. One dose is recommended after age 74. Talk to your health care provider about which screenings and vaccines you need and how often you need them. This information is not intended to replace advice given to you by your health care provider. Make sure you discuss any questions you have with your health care provider. Document Released: 05/26/2015 Document Revised: 01/17/2016 Document Reviewed: 02/28/2015 Elsevier Interactive Patient Education  2017 [place] Prevention in the Home Falls can cause injuries. They can happen to people of all ages. There are many things you can do to make your home safe and to help prevent falls. What can I do on the outside of my home? Regularly fix the edges of walkways and driveways and fix any cracks. Remove anything that might make you trip as you walk through a door, such as a raised step or threshold. Trim any bushes or trees on the path to your home. Use bright outdoor lighting. Clear any walking paths of anything that might make someone trip, such as rocks or tools. Regularly check to see if handrails are loose or broken. Make sure that both sides of any steps have handrails. Any raised decks and porches should have guardrails on the edges. Have any leaves, snow, or ice cleared regularly. Use sand or salt on walking paths during winter. Clean up any spills in your garage right away. This includes oil or grease spills. What can I do in the bathroom? Use night lights. Install grab bars by the toilet and in the tub and shower. Do not use towel bars as grab bars. Use non-skid mats or decals in the tub or  shower. If you need to sit down in the shower, use a plastic, non-slip stool. Keep the floor dry. Clean up any water that spills on the floor as soon as it happens. Remove soap buildup in the tub or shower regularly. Attach bath mats securely with double-sided non-slip rug tape. Do not have throw rugs and other things on the floor that can make you trip. What can I do in the bedroom? Use night lights. Make sure that you have a light by your bed that is easy to reach. Do not use any sheets or blankets that are too big for your bed. They should not hang down onto the floor. Have a firm chair that has side arms. You can use this for support while you get dressed. Do not have throw rugs and other things on the floor that can make you trip. What can I do in the kitchen? Clean up any spills right away. Avoid walking on wet floors. Keep items that you use a lot in easy-to-reach places. If you need to reach something above you, use a strong step stool that has a grab bar. Keep electrical cords out of the way. Do not use  floor polish or wax that makes floors slippery. If you must use wax, use non-skid floor wax. Do not have throw rugs and other things on the floor that can make you trip. What can I do with my stairs? Do not leave any items on the stairs. Make sure that there are handrails on both sides of the stairs and use them. Fix handrails that are broken or loose. Make sure that handrails are as long as the stairways. Check any carpeting to make sure that it is firmly attached to the stairs. Fix any carpet that is loose or worn. Avoid having throw rugs at the top or bottom of the stairs. If you do have throw rugs, attach them to the floor with carpet tape. Make sure that you have a light switch at the top of the stairs and the bottom of the stairs. If you do not have them, ask someone to add them for you. What else can I do to help prevent falls? Wear shoes that: Do not have high heels. Have  rubber bottoms. Are comfortable and fit you well. Are closed at the toe. Do not wear sandals. If you use a stepladder: Make sure that it is fully opened. Do not climb a closed stepladder. Make sure that both sides of the stepladder are locked into place. Ask someone to hold it for you, if possible. Clearly mark and make sure that you can see: Any grab bars or handrails. First and last steps. Where the edge of each step is. Use tools that help you move around (mobility aids) if they are needed. These include: Canes. Walkers. Scooters. Crutches. Turn on the lights when you go into a dark area. Replace any light bulbs as soon as they burn out. Set up your furniture so you have a clear path. Avoid moving your furniture around. If any of your floors are uneven, fix them. If there are any pets around you, be aware of where they are. Review your medicines with your doctor. Some medicines can make you feel dizzy. This can increase your chance of falling. Ask your doctor what other things that you can do to help prevent falls. This information is not intended to replace advice given to you by your health care provider. Make sure you discuss any questions you have with your health care provider. Document Released: 02/23/2009 Document Revised: 10/05/2015 Document Reviewed: 06/03/2014 Elsevier Interactive Patient Education  2017 [employer].

## 2022-06-07 ENCOUNTER — Other Ambulatory Visit: Payer: Medicare Other

## 2022-06-07 DIAGNOSIS — Z0000 Encounter for general adult medical examination without abnormal findings: Secondary | ICD-10-CM | POA: Diagnosis not present

## 2022-06-07 LAB — CBC WITH DIFFERENTIAL/PLATELET

## 2022-06-07 LAB — LIPID PANEL

## 2022-06-08 LAB — CMP14+EGFR
ALT: 21 IU/L (ref 0–44)
AST: 20 IU/L (ref 0–40)
Albumin/Globulin Ratio: 1.7 (ref 1.2–2.2)
Albumin: 4.1 g/dL (ref 3.8–4.8)
Alkaline Phosphatase: 70 IU/L (ref 44–121)
BUN/Creatinine Ratio: 22 (ref 10–24)
BUN: 17 mg/dL (ref 8–27)
Bilirubin Total: 0.3 mg/dL (ref 0.0–1.2)
CO2: 23 mmol/L (ref 20–29)
Calcium: 8.9 mg/dL (ref 8.6–10.2)
Chloride: 104 mmol/L (ref 96–106)
Creatinine, Ser: 0.78 mg/dL (ref 0.76–1.27)
Globulin, Total: 2.4 g/dL (ref 1.5–4.5)
Glucose: 94 mg/dL (ref 70–99)
Potassium: 4.5 mmol/L (ref 3.5–5.2)
Sodium: 141 mmol/L (ref 134–144)
Total Protein: 6.5 g/dL (ref 6.0–8.5)
eGFR: 94 mL/min/{1.73_m2} (ref >59)

## 2022-06-08 LAB — PSA, TOTAL AND FREE
PSA, Free Pct: 18.4 %
PSA, Free: 0.35 ng/mL
Prostate Specific Ag, Serum: 1.9 ng/mL (ref 0.0–4.0)

## 2022-06-08 LAB — CBC WITH DIFFERENTIAL/PLATELET
Basophils Absolute: 0 10*3/uL (ref 0.0–0.2)
Basos: 1 %
EOS (ABSOLUTE): 0.2 10*3/uL (ref 0.0–0.4)
Eos: 3 %
Hematocrit: 39.7 % (ref 37.5–51.0)
Hemoglobin: 13.7 g/dL (ref 13.0–17.7)
Immature Grans (Abs): 0 10*3/uL (ref 0.0–0.1)
Immature Granulocytes: 0 %
Lymphocytes Absolute: 2 10*3/uL (ref 0.7–3.1)
Lymphs: 40 %
MCH: 31.6 pg (ref 26.6–33.0)
MCHC: 34.5 g/dL (ref 31.5–35.7)
MCV: 92 fL (ref 79–97)
Monocytes Absolute: 0.5 10*3/uL (ref 0.1–0.9)
Monocytes: 10 %
Neutrophils Absolute: 2.4 10*3/uL (ref 1.4–7.0)
Neutrophils: 46 %
Platelets: 214 10*3/uL (ref 150–450)
RBC: 4.33 x10E6/uL (ref 4.14–5.80)
RDW: 11.8 % (ref 11.6–15.4)
WBC: 5 10*3/uL (ref 3.4–10.8)

## 2022-06-08 LAB — LIPID PANEL
Chol/HDL Ratio: 3.2 ratio (ref 0.0–5.0)
Cholesterol, Total: 165 mg/dL (ref 100–199)
HDL: 52 mg/dL (ref >39)
LDL Chol Calc (NIH): 101 mg/dL — ABNORMAL HIGH (ref 0–99)
Triglycerides: 62 mg/dL (ref 0–149)
VLDL Cholesterol Cal: 12 mg/dL (ref 5–40)

## 2022-06-08 LAB — VITAMIN D 25 HYDROXY (VIT D DEFICIENCY, FRACTURES): Vit D, 25-Hydroxy: 44.4 ng/mL (ref 30.0–100.0)

## 2022-06-11 DIAGNOSIS — H52203 Unspecified astigmatism, bilateral: Secondary | ICD-10-CM | POA: Diagnosis not present

## 2022-06-12 ENCOUNTER — Encounter: Payer: Self-pay | Admitting: Family Medicine

## 2022-06-12 ENCOUNTER — Telehealth (INDEPENDENT_AMBULATORY_CARE_PROVIDER_SITE_OTHER): Payer: Medicare Other | Admitting: Family Medicine

## 2022-06-12 DIAGNOSIS — H04201 Unspecified epiphora, right lacrimal gland: Secondary | ICD-10-CM | POA: Diagnosis not present

## 2022-06-12 DIAGNOSIS — H5711 Ocular pain, right eye: Secondary | ICD-10-CM

## 2022-06-12 DIAGNOSIS — H5789 Other specified disorders of eye and adnexa: Secondary | ICD-10-CM

## 2022-06-12 MED ORDER — POLYMYXIN B-TRIMETHOPRIM 10000-0.1 UNIT/ML-% OP SOLN: 1.0000 [drp] | Freq: Four times a day (QID) | OPHTHALMIC | 0 refills | Status: AC

## 2022-06-12 NOTE — Progress Notes (Signed)
Virtual Visit via MyChart Video Note Due to COVID-19 pandemic this visit was conducted virtually. This visit type was conducted due to national recommendations for restrictions regarding the COVID-19 Pandemic (e.g. social distancing, sheltering in place) in an effort to limit this patient's exposure and mitigate transmission in our community. All issues noted in this document were discussed and addressed.  A physical exam was not performed with this format.   I connected with Matthew Vasquez on 06/12/2022 at 1140 by MyChart Video and verified that I am speaking with the correct person using two identifiers. Matthew Vasquez is currently located at home and family is currently with them during visit. The provider, Monia Pouch, FNP is located in their office at time of visit.  I discussed the limitations, risks, security and privacy concerns of performing an evaluation and management service by virtual visit and the availability of in person appointments. I also discussed with the patient that there may be a patient responsible charge related to this service. The patient expressed understanding and agreed to proceed.  Subjective:  Patient ID: Matthew Vasquez, male    DOB: [birth year], 72 y.o.   MRN: [id number]  Chief Complaint:  Eye Problem   HPI: Matthew Vasquez is a 72 y.o. male presenting on 06/12/2022 for Eye Problem   Pt reports he thinks he got an eyelash in his right eye last night and scratched his eye trying to get it out. He states he has pain, redness, and watering of his eye. No visual changes. No other associated symptoms.   Eye Problem  The right eye is affected. This is a new problem. The current episode started yesterday. The problem occurs constantly. The injury mechanism is unknown (possible eyelash). The pain is moderate. There is No known exposure to pink eye. He Does not wear contacts. Associated symptoms include an eye discharge, eye redness and a foreign body sensation.  Pertinent negatives include no blurred vision, double vision, fever, itching, nausea, photophobia, recent URI, vomiting or weakness. He has tried eye drops for the symptoms. The treatment provided no relief.     Relevant past medical, surgical, family, and social history reviewed and updated as indicated.  Allergies and medications reviewed and updated.   Past Medical History:  Diagnosis Date   Allergy    moderate per pt   Blood transfusion without reported diagnosis    age 22 yo    BPH (benign prostatic hyperplasia)    History of rectal bleeding    Hyperlipidemia     Past Surgical History:  Procedure Laterality Date   COLONOSCOPY     TONSILLECTOMY      Social History   Socioeconomic History   Marital status: Married    Spouse name: Not on file   Number of children: Not on file   Years of education: Not on file   Highest education level: Not on file  Occupational History   Occupation: retired  Tobacco Use   Smoking status: Never   Smokeless tobacco: Never  Vaping Use   Vaping Use: Never used  Substance and Sexual Activity   Alcohol use: Yes    Alcohol/week: 2.0 standard drinks of alcohol    Types: 1 Glasses of wine, 1 Standard drinks or equivalent per week   Drug use: No   Sexual activity: Yes  Other Topics Concern   Not on file  Social History Narrative   Not on file   Social Determinants of Health   Financial Resource  Strain: Low Risk  (06/03/2022)   Overall Financial Resource Strain (CARDIA)    Difficulty of Paying Living Expenses: Not hard at all  Food Insecurity: No Food Insecurity (06/03/2022)   Hunger Vital Sign    Worried About Running Out of Food in the Last Year: Never true    Ran Out of Food in the Last Year: Never true  Transportation Needs: No Transportation Needs (06/03/2022)   PRAPARE - Hydrologist (Medical): No    Lack of Transportation (Non-Medical): No  Physical Activity: Sufficiently Active (06/03/2022)    Exercise Vital Sign    Days of Exercise per Week: 6 days    Minutes of Exercise per Session: 60 min  Stress: No Stress Concern Present (06/03/2022)   [place]    Feeling of Stress : Not at all  Social Connections: [place] (06/03/2022)   Social Connection and Isolation Panel [NHANES]    Frequency of Communication with Friends and Family: More than three times a week    Frequency of Social Gatherings with Friends and Family: More than three times a week    Attends Religious Services: More than 4 times per year    Active Member of [employer] or Organizations: Yes    Attends Music therapist: More than 4 times per year    Marital Status: Married  Human resources officer Violence: Not At Risk (06/03/2022)   Humiliation, Afraid, Rape, and Kick questionnaire    Fear of Current or Ex-Partner: No    Emotionally Abused: No    Physically Abused: No    Sexually Abused: No    Outpatient Encounter Medications as of 06/12/2022  Medication Sig   trimethoprim-polymyxin b (POLYTRIM) ophthalmic solution Place 1 drop into the right eye in the morning, at noon, in the evening, and at bedtime for 7 days.   cholecalciferol (VITAMIN D) 1000 UNITS tablet Take 1,000 Units by mouth daily.   Guaifenesin 1200 MG TB12 Take by mouth in the morning and at bedtime.   tadalafil (CIALIS) 10 MG tablet Take 1-2 tablets (10-20 mg total) by mouth daily as needed for erectile dysfunction.   Triprolidine-Pseudoephedrine (ANTIHISTAMINE PO) Take by mouth.   No facility-administered encounter medications on file as of 06/12/2022.    No Known Allergies  Review of Systems  Constitutional:  Negative for activity change, appetite change, chills, diaphoresis, fatigue, fever and unexpected weight change.  HENT:  Negative for congestion and rhinorrhea.   Eyes:  Positive for pain, discharge and redness. Negative for blurred vision, double vision,  photophobia, itching and visual disturbance.  Respiratory:  Negative for cough and shortness of breath.   Cardiovascular:  Negative for chest pain, palpitations and leg swelling.  Gastrointestinal:  Negative for nausea and vomiting.  Genitourinary:  Negative for decreased urine volume and difficulty urinating.  Skin:  Negative for color change and rash.  Neurological:  Negative for dizziness, tremors, seizures, syncope, facial asymmetry, speech difficulty, weakness, light-headedness, numbness and headaches.  Psychiatric/Behavioral:  Negative for confusion.   All other systems reviewed and are negative.      Observations/Objective: No vital signs or physical exam, this was a virtual health encounter.  Pt alert and oriented, answers all questions appropriately, and able to speak in full sentences. Right eye slightly red in video with watering. No swelling or purulent drainage.    Assessment and Plan: Howie was seen today for eye problem.  Diagnoses and all orders for this  visit:  Redness of right eye Eye pain, right Watering of right eye No red flags concerning for acute globe rupture or ocular injury. Pt aware of red flags which require emergent evaluation and treatment. Symptomatic care discussed in detail. Medications as prescribed. Follow up if not improving.  -     trimethoprim-polymyxin b (POLYTRIM) ophthalmic solution; Place 1 drop into the right eye in the morning, at noon, in the evening, and at bedtime for 7 days.     Follow Up Instructions: Return if symptoms worsen or fail to improve.    I discussed the assessment and treatment plan with the patient. The patient was provided an opportunity to ask questions and all were answered. The patient agreed with the plan and demonstrated an understanding of the instructions.   The patient was advised to call back or seek an in-person evaluation if the symptoms worsen or if the condition fails to improve as anticipated.  The  above assessment and management plan was discussed with the patient. The patient verbalized understanding of and has agreed to the management plan. Patient is aware to call the clinic if they develop any new symptoms or if symptoms persist or worsen. Patient is aware when to return to the clinic for a follow-up visit. Patient educated on when it is appropriate to go to the emergency department.    I provided 12 minutes of time during this MyChart Video encounter.   Monia Pouch, FNP-C [facility] [address] [place], [place] [address] [phone] 06/12/2022

## 2022-06-14 ENCOUNTER — Encounter: Payer: Self-pay | Admitting: Family Medicine

## 2022-06-14 ENCOUNTER — Ambulatory Visit (INDEPENDENT_AMBULATORY_CARE_PROVIDER_SITE_OTHER): Payer: Medicare Other | Admitting: Family Medicine

## 2022-06-14 VITALS — BP 123/76 | HR 74 | Ht 71.0 in | Wt 169.0 lb

## 2022-06-14 DIAGNOSIS — N5201 Erectile dysfunction due to arterial insufficiency: Secondary | ICD-10-CM | POA: Diagnosis not present

## 2022-06-14 DIAGNOSIS — H524 Presbyopia: Secondary | ICD-10-CM | POA: Diagnosis not present

## 2022-06-14 DIAGNOSIS — Z0001 Encounter for general adult medical examination with abnormal findings: Secondary | ICD-10-CM

## 2022-06-14 DIAGNOSIS — E7800 Pure hypercholesterolemia, unspecified: Secondary | ICD-10-CM | POA: Diagnosis not present

## 2022-06-14 DIAGNOSIS — Z0000 Encounter for general adult medical examination without abnormal findings: Secondary | ICD-10-CM

## 2022-06-14 DIAGNOSIS — R0789 Other chest pain: Secondary | ICD-10-CM

## 2022-06-14 DIAGNOSIS — N400 Enlarged prostate without lower urinary tract symptoms: Secondary | ICD-10-CM | POA: Diagnosis not present

## 2022-06-14 MED ORDER — TADALAFIL 10 MG PO TABS: 10.0000 mg | ORAL_TABLET | Freq: Every day | ORAL | 3 refills | Status: DC | PRN

## 2022-06-14 NOTE — Progress Notes (Signed)
BP 123/76   Pulse 74   Ht '5\' 11"'$  (1.803 m)   Wt 169 lb (76.7 kg)   SpO2 97%   BMI 23.57 kg/m    Subjective:   Patient ID: Matthew Vasquez, male    DOB: [birth year], 72 y.o.   MRN: [id number]  HPI: Matthew Vasquez is a 72 y.o. male presenting on 06/14/2022 for Medical Management of Chronic Issues (CPE)   HPI Physical exam Patient denies any chest pain, shortness of breath, headaches or vision issues, abdominal complaints, diarrhea, nausea, vomiting, or joint issues.   Hyperlipidemia Patient is coming in for recheck of his hyperlipidemia. The patient is currently taking diet control, trying to stay off medicine. They deny any issues with myalgias or history of liver damage from it. They deny any focal numbness or weakness or chest pain.   BPH Patient is coming in for recheck on BPH Symptoms: None currently Medication: Cialis as needed Last PSA: 1.9 last week  Patient's biggest complaint today is that he has this intermittent right-sided chest tightness/pressure that he gets occasionally.  He says it will come sometimes when he is doing small stuff around the house but not typically when he is walking or exerting himself and not at rest either.  He says it will pass quickly when he takes a few deep breaths to calm it down.  He says he has not noted any other symptoms associated with it.  He does not have any chest pain today, he says he only gets this occasionally like once a week.  The 10-year ASCVD risk score (Arnett DK, et al., 2019) is: 18.9%   Values used to calculate the score:     Age: 83 years     Sex: Male     Is Non-Hispanic African American: No     Diabetic: No     Tobacco smoker: No     Systolic Blood Pressure: 389 mmHg     Is BP treated: No     HDL Cholesterol: 52 mg/dL     Total Cholesterol: 165 mg/dL   Relevant past medical, surgical, family and social history reviewed and updated as indicated. Interim medical history since our last visit reviewed. Allergies and  medications reviewed and updated.  Review of Systems  Constitutional:  Negative for chills and fever.  HENT:  Negative for ear pain and tinnitus.   Eyes:  Negative for pain and discharge.  Respiratory:  Negative for cough, shortness of breath and wheezing.   Cardiovascular:  Negative for chest pain, palpitations and leg swelling.  Gastrointestinal:  Negative for abdominal pain, blood in stool, constipation and diarrhea.  Genitourinary:  Negative for dysuria and hematuria.  Musculoskeletal:  Negative for back pain, gait problem and myalgias.  Skin:  Negative for rash.  Neurological:  Negative for dizziness, weakness and headaches.  Psychiatric/Behavioral:  Negative for suicidal ideas.   All other systems reviewed and are negative.   Per HPI unless specifically indicated above   Allergies as of 06/14/2022   No Known Allergies      Medication List        Accurate as of June 14, 2022  3:13 PM. If you have any questions, ask your nurse or doctor.          ANTIHISTAMINE PO Take by mouth.   cholecalciferol 1000 units tablet Commonly known as: VITAMIN D Take 1,000 Units by mouth daily.   Guaifenesin 1200 MG Tb12 Take by mouth in the morning and at  bedtime.   tadalafil 10 MG tablet Commonly known as: Cialis Take 1-2 tablets (10-20 mg total) by mouth daily as needed for erectile dysfunction.   trimethoprim-polymyxin b ophthalmic solution Commonly known as: Polytrim Place 1 drop into the right eye in the morning, at noon, in the evening, and at bedtime for 7 days.         Objective:   BP 123/76   Pulse 74   Ht '5\' 11"'$  (1.803 m)   Wt 169 lb (76.7 kg)   SpO2 97%   BMI 23.57 kg/m   Wt Readings from Last 3 Encounters:  06/14/22 169 lb (76.7 kg)  06/03/22 163 lb (73.9 kg)  04/18/21 165 lb (74.8 kg)    Physical Exam Vitals and nursing note reviewed.  Constitutional:      General: He is not in acute distress.    Appearance: Normal appearance. He is  well-developed. He is not diaphoretic.  HENT:     Right Ear: External ear normal.     Left Ear: External ear normal.     Nose: Nose normal.     Mouth/Throat:     Pharynx: No oropharyngeal exudate.  Eyes:     General: No scleral icterus.       Right eye: No discharge.     Conjunctiva/sclera: Conjunctivae normal.     Pupils: Pupils are equal, round, and reactive to light.  Neck:     Thyroid: No thyromegaly.  Cardiovascular:     Rate and Rhythm: Normal rate and regular rhythm.     Heart sounds: Normal heart sounds. No murmur heard. Pulmonary:     Effort: Pulmonary effort is normal. No respiratory distress.     Breath sounds: Normal breath sounds. No wheezing.  Abdominal:     General: Bowel sounds are normal. There is no distension.     Palpations: Abdomen is soft.     Tenderness: There is no abdominal tenderness. There is no guarding or rebound.  Musculoskeletal:        General: Normal range of motion.     Cervical back: Neck supple.  Lymphadenopathy:     Cervical: No cervical adenopathy.  Skin:    General: Skin is warm and dry.     Findings: No rash.  Neurological:     Mental Status: He is alert and oriented to person, place, and time.     Coordination: Coordination normal.  Psychiatric:        Behavior: Behavior normal.     Results for orders placed or performed in visit on 06/07/22  CBC with Differential/Platelet  Result Value Ref Range   WBC 5.0 3.4 - 10.8 x10E3/uL   RBC 4.33 4.14 - 5.80 x10E6/uL   Hemoglobin 13.7 13.0 - 17.7 g/dL   Hematocrit 39.7 37.5 - 51.0 %   MCV 92 79 - 97 fL   MCH 31.6 26.6 - 33.0 pg   MCHC 34.5 31.5 - 35.7 g/dL   RDW 11.8 11.6 - 15.4 %   Platelets 214 150 - 450 x10E3/uL   Neutrophils 46 Not Estab. %   Lymphs 40 Not Estab. %   Monocytes 10 Not Estab. %   Eos 3 Not Estab. %   Basos 1 Not Estab. %   Neutrophils Absolute 2.4 1.4 - 7.0 x10E3/uL   Lymphocytes Absolute 2.0 0.7 - 3.1 x10E3/uL   Monocytes Absolute 0.5 0.1 - 0.9 x10E3/uL    EOS (ABSOLUTE) 0.2 0.0 - 0.4 x10E3/uL   Basophils Absolute 0.0 0.0 - 0.2  x10E3/uL   Immature Granulocytes 0 Not Estab. %   Immature Grans (Abs) 0.0 0.0 - 0.1 x10E3/uL  CMP14+EGFR  Result Value Ref Range   Glucose 94 70 - 99 mg/dL   BUN 17 8 - 27 mg/dL   Creatinine, Ser 0.78 0.76 - 1.27 mg/dL   eGFR 94 >59 mL/min/1.73   BUN/Creatinine Ratio 22 10 - 24   Sodium 141 134 - 144 mmol/L   Potassium 4.5 3.5 - 5.2 mmol/L   Chloride 104 96 - 106 mmol/L   CO2 23 20 - 29 mmol/L   Calcium 8.9 8.6 - 10.2 mg/dL   Total Protein 6.5 6.0 - 8.5 g/dL   Albumin 4.1 3.8 - 4.8 g/dL   Globulin, Total 2.4 1.5 - 4.5 g/dL   Albumin/Globulin Ratio 1.7 1.2 - 2.2   Bilirubin Total 0.3 0.0 - 1.2 mg/dL   Alkaline Phosphatase 70 44 - 121 IU/L   AST 20 0 - 40 IU/L   ALT 21 0 - 44 IU/L  Lipid panel  Result Value Ref Range   Cholesterol, Total 165 100 - 199 mg/dL   Triglycerides 62 0 - 149 mg/dL   HDL 52 >39 mg/dL   VLDL Cholesterol Cal 12 5 - 40 mg/dL   LDL Chol Calc (NIH) 101 (H) 0 - 99 mg/dL   Chol/HDL Ratio 3.2 0.0 - 5.0 ratio  PSA, total and free  Result Value Ref Range   Prostate Specific Ag, Serum 1.9 0.0 - 4.0 ng/mL   PSA, Free 0.35 N/A ng/mL   PSA, Free Pct 18.4 %  VITAMIN D 25 Hydroxy (Vit-D Deficiency, Fractures)  Result Value Ref Range   Vit D, 25-Hydroxy 44.4 30.0 - 100.0 ng/mL    Assessment & Plan:   Problem List Items Addressed This Visit       Genitourinary   BPH (benign prostatic hyperplasia)     Other   Hyperlipidemia   Relevant Medications   tadalafil (CIALIS) 10 MG tablet   Erectile dysfunction   Relevant Medications   tadalafil (CIALIS) 10 MG tablet   Other Visit Diagnoses     Physical exam    -  Primary   Atypical chest pain       Relevant Orders   Ambulatory referral to Cardiology     Will refer to cardiology for atypical intermittent chest discomfort for screening evaluation.  Patient cardiac risk score is 18% he does warrant a possible screening mainly because  of his age and cholesterol.  Follow up plan: Return in about 1 year (around 06/15/2023), or if symptoms worsen or fail to improve, for Physical exam.  Counseling provided for all of the vaccine components Orders Placed This Encounter  Procedures   Ambulatory referral to Cardiology    Caryl Pina, MD [facility] 06/14/2022, 3:13 PM

## 2022-07-08 DIAGNOSIS — K080 Exfoliation of teeth due to systemic causes: Secondary | ICD-10-CM | POA: Diagnosis not present

## 2022-08-05 ENCOUNTER — Telehealth: Payer: Self-pay | Admitting: Family Medicine

## 2022-08-05 NOTE — Telephone Encounter (Signed)
Contacted Matthew Vasquez to schedule their annual wellness visit. Appointment made for 06/05/2023.  Thank you,  Colletta Maryland,  [facility] Direct Dial ??[phone]

## 2022-08-08 DIAGNOSIS — K080 Exfoliation of teeth due to systemic causes: Secondary | ICD-10-CM | POA: Diagnosis not present

## 2022-08-12 NOTE — Progress Notes (Unsigned)
Cardiology Office Note   Date:  08/14/2022   ID:  Matthew Vasquez, DOB Dec 25, 1950, MRN [id number]  PCP:  Dettinger, Fransisca Kaufmann, MD  Cardiologist:   None Referring:  Dettinger, Fransisca Kaufmann, MD  Chief Complaint  Patient presents with   Chest Pain      History of Present Illness: Matthew Vasquez is a 72 y.o. male who presents for evaluation of chest pain.  He had no prior cardiac history. He had a negative POET (Plain Old Exercise Treadmill) in 2018.  He said the chest discomfort he has been getting for about a year.  It might be similar to what he had in 2018.  It is right-sided.  It is kind of a dull ache discomfort 2 out of 10 in intensity.  There is no associated nausea vomiting or diaphoresis.  There is no associated shortness of breath, PND or orthopnea.  He cannot bring it on with activity.  Comes on sporadically.  He walks for exercise 3 times a week and does not bring this on.  He thinks it is relatively unchanged in intensity or frequency.  He does not have any palpitations, presyncope or syncope.  He has not had no weight gain or edema.   Past Medical History:  Diagnosis Date   Allergy    moderate per pt   Blood transfusion without reported diagnosis    age 61 yo    BPH (benign prostatic hyperplasia)    History of rectal bleeding    Hyperlipidemia     Past Surgical History:  Procedure Laterality Date   COLONOSCOPY     TONSILLECTOMY       Current Outpatient Medications  Medication Sig Dispense Refill   amoxicillin (AMOXIL) 875 MG tablet Take 875 mg by mouth 2 (two) times daily.     cholecalciferol (VITAMIN D) 1000 UNITS tablet Take 1,000 Units by mouth daily.     Dextromethorphan-guaiFENesin (MUCINEX DM PO) Take 1,200 mg by mouth at bedtime.     loratadine (CLARITIN) 10 MG tablet Take 10 mg by mouth daily.     omeprazole (PRILOSEC) 20 MG capsule Take 20 mg by mouth daily.     tadalafil (CIALIS) 10 MG tablet Take 1-2 tablets (10-20 mg total) by mouth daily as needed  for erectile dysfunction. 60 tablet 3   No current facility-administered medications for this visit.    Allergies:   Patient has no known allergies.    Social History:  The patient  reports that he has never smoked. He has never used smokeless tobacco. He reports current alcohol use of about 2.0 standard drinks of alcohol per week. He reports that he does not use drugs.   Family History:  The patient's family history includes Bone cancer in his father; Cancer in his father; Epilepsy in his brother; Heart disease in his mother and sister; Prostate cancer in his father.    ROS:  Please see the history of present illness.   Otherwise, review of systems are positive for none.   All other systems are reviewed and negative.    PHYSICAL EXAM: VS:  BP 112/76   Pulse 61   Ht 5\' 11"  (1.803 m)   Wt 166 lb (75.3 kg)   BMI 23.15 kg/m  , BMI Body mass index is 23.15 kg/m. GENERAL:  Well appearing HEENT:  Pupils equal round and reactive, fundi not visualized, oral mucosa unremarkable NECK:  No jugular venous distention, waveform within normal limits, carotid upstroke brisk and  symmetric, no bruits, no thyromegaly LYMPHATICS:  No cervical, inguinal adenopathy LUNGS:  Clear to auscultation bilaterally BACK:  No CVA tenderness CHEST:  Unremarkable HEART:  PMI not displaced or sustained,S1 and S2 within normal limits, no S3, no S4, no clicks, no rubs, no murmurs ABD:  Flat, positive bowel sounds normal in frequency in pitch, no bruits, no rebound, no guarding, no midline pulsatile mass, no hepatomegaly, no splenomegaly EXT:  2 plus pulses throughout, no edema, no cyanosis no clubbing SKIN:  No rashes no nodules NEURO:  Cranial nerves II through XII grossly intact, motor grossly intact throughout PSYCH:  Cognitively intact, oriented to person place and time    EKG:  EKG is ordered today. The ekg ordered today demonstrates sinus rhythm rate 61, leftward axis, no acute ST-T wave  changes.   Recent Labs: 06/07/2022: ALT 21; BUN 17; Creatinine, Ser 0.78; Hemoglobin 13.7; Platelets 214; Potassium 4.5; Sodium 141    Lipid Panel    Component Value Date/Time   CHOL 165 06/07/2022 0810   TRIG 62 06/07/2022 0810   TRIG 60 03/31/2013 0823   HDL 52 06/07/2022 0810   HDL 54 03/31/2013 0823   CHOLHDL 3.2 06/07/2022 0810   LDLCALC 101 (H) 06/07/2022 0810   LDLCALC 106 (H) 03/31/2013 0823      Wt Readings from Last 3 Encounters:  08/14/22 166 lb (75.3 kg)  06/14/22 169 lb (76.7 kg)  06/03/22 163 lb (73.9 kg)      Other studies Reviewed: Additional studies/ records that were reviewed today include: Labs. Review of the above records demonstrates:  Please see elsewhere in the note.     ASSESSMENT AND PLAN:  Chest pain: Chest discomfort has nonanginal greater than anginal features. POET (Plain Old Exercise Treadmill).  However also like to order a coronary calcium score which will determine goals of therapy.  Dyslipidemia: LDL was 101 with an HDL of 52.  Goals of therapy will be based on the calcium score.   Current medicines are reviewed at length with the patient today.  The patient does not have concerns regarding medicines.  The following changes have been made:  no change  Labs/ tests ordered today include:   Orders Placed This Encounter  Procedures   CT CARDIAC SCORING (SELF PAY ONLY)   EXERCISE TOLERANCE TEST (ETT)   EKG 12-Lead     Disposition:   FU with me based on the results of the above.   Signed, Minus Breeding, MD  08/14/2022 10:50 AM    [place]

## 2022-08-14 ENCOUNTER — Encounter: Payer: Self-pay | Admitting: Cardiology

## 2022-08-14 ENCOUNTER — Ambulatory Visit: Payer: Medicare Other | Admitting: Cardiology

## 2022-08-14 VITALS — BP 112/76 | HR 61 | Ht 71.0 in | Wt 166.0 lb

## 2022-08-14 DIAGNOSIS — R072 Precordial pain: Secondary | ICD-10-CM

## 2022-08-14 NOTE — Addendum Note (Signed)
Addended by: Shellia Cleverly on: 08/14/2022 10:57 AM   Modules accepted: Orders

## 2022-08-14 NOTE — Patient Instructions (Signed)
Medication Instructions:  The current medical regimen is effective;  continue present plan and medications.  *If you need a refill on your cardiac medications before your next appointment, please call your pharmacy*   Testing/Procedures: Your physician has requested that you have a Coronary Calcium score which is completed by CT. Cardiac computed tomography (CT) is a painless test that uses an x-ray machine to take clear, detailed pictures of your heart. There are no instructions for this testing.  You may eat/drink and take your normal medications this day.  The cost of the testing is $99 due at the time of your appointment.  You are being scheduled for a treadmill.  Please follow the instructions given.    The above testing will be completed at [facility] and you will be contacted to be scheduled.  Follow-Up: At [facility], you and your health needs are our priority.  As part of our continuing mission to provide you with exceptional heart care, we have created designated Provider Care Teams.  These Care Teams include your primary Cardiologist (physician) and Advanced Practice Providers (APPs -  Physician Assistants and Nurse Practitioners) who all work together to provide you with the care you need, when you need it.  We recommend signing up for the patient portal called "MyChart".  Sign up information is provided on this After Visit Summary.  MyChart is used to connect with patients for Virtual Visits (Telemedicine).  Patients are able to view lab/test results, encounter notes, upcoming appointments, etc.  Non-urgent messages can be sent to your provider as well.   To learn more about what you can do with MyChart, go to [email or web address].    Your next appointment:   Follow up will be based on the results of the above testing.

## 2022-08-21 NOTE — Addendum Note (Signed)
Addended by: Rollene Rotunda on: 08/21/2022 09:07 AM   Modules accepted: Orders

## 2022-08-23 ENCOUNTER — Ambulatory Visit (HOSPITAL_COMMUNITY)
Admission: RE | Admit: 2022-08-23 | Discharge: 2022-08-23 | Disposition: A | Discharge status: Home / Self Care | Payer: Medicare Other | Source: Ambulatory Visit | Attending: Cardiology | Admitting: Cardiology

## 2022-08-23 DIAGNOSIS — R072 Precordial pain: Secondary | ICD-10-CM

## 2022-08-23 LAB — EXERCISE TOLERANCE TEST
Angina Index: 0
Estimated workload: 9.9
Exercise duration (min): 6 min
Exercise duration (sec): 58 s
MPHR: 147 {beats}/min
Peak HR: 131 {beats}/min
Percent HR: 89 %
RPE: 13
Rest HR: 60 {beats}/min

## 2022-09-02 ENCOUNTER — Telehealth: Payer: Self-pay | Admitting: *Deleted

## 2022-09-02 ENCOUNTER — Encounter (HOSPITAL_COMMUNITY): Payer: Self-pay | Admitting: Cardiology

## 2022-09-02 ENCOUNTER — Encounter: Payer: Self-pay | Admitting: *Deleted

## 2022-09-02 DIAGNOSIS — R9439 Abnormal result of other cardiovascular function study: Secondary | ICD-10-CM

## 2022-09-02 NOTE — Telephone Encounter (Signed)
-----   Message from Rollene Rotunda, MD sent at 08/31/2022 12:10 PM EDT ----- Borderline EKG changes.  He needs a perfusion study as I cannot call this a negative study.  Call Mr. Difatta with the results and send results to Dettinger, Elige Radon, MD

## 2022-09-02 NOTE — Telephone Encounter (Signed)
Spoke with pt, aware of results and he agrees to come to [facility] for nuclear testing. Order placed and instructions sent to patient via my chart.

## 2022-09-04 ENCOUNTER — Telehealth (HOSPITAL_COMMUNITY): Payer: Self-pay | Admitting: *Deleted

## 2022-09-04 NOTE — Telephone Encounter (Signed)
Patient given detailed instructions per Myocardial Perfusion Study Information Sheet for the test on 09/10/2022 at 7:30. Patient notified to arrive 15 minutes early and that it is imperative to arrive on time for appointment to keep from having the test rescheduled.  If you need to cancel or reschedule your appointment, please call the office within 24 hours of your appointment. . Patient verbalized understanding.Daneil Dolin

## 2022-09-06 ENCOUNTER — Ambulatory Visit (HOSPITAL_COMMUNITY): Payer: Medicare Other | Attending: Cardiovascular Disease

## 2022-09-06 DIAGNOSIS — R9439 Abnormal result of other cardiovascular function study: Secondary | ICD-10-CM | POA: Diagnosis not present

## 2022-09-06 LAB — MYOCARDIAL PERFUSION IMAGING
Angina Index: 0
Duke Treadmill Score: 12
Estimated workload: 13.4
Exercise duration (min): 12 min
Exercise duration (sec): 0 s
LV dias vol: 83 mL (ref 62–150)
LV sys vol: 29 mL
MPHR: 147 {beats}/min
Nuc Stress EF: 65 %
Peak HR: 148 {beats}/min
Percent HR: 100 %
Rest HR: 59 {beats}/min
Rest Nuclear Isotope Dose: 10.9 mCi
SDS: 1
SRS: 0
SSS: 1
ST Depression (mm): 0 mm
Stress Nuclear Isotope Dose: 32.2 mCi
TID: 0.87

## 2022-09-06 MED ORDER — TECHNETIUM TC 99M TETROFOSMIN IV KIT
10.9000 | PACK | Freq: Once | INTRAVENOUS | Status: AC | PRN
  Administered 2022-09-06: 10.9 via INTRAVENOUS

## 2022-09-06 MED ORDER — TECHNETIUM TC 99M TETROFOSMIN IV KIT
32.2000 | PACK | Freq: Once | INTRAVENOUS | Status: AC | PRN
  Administered 2022-09-06: 32.2 via INTRAVENOUS

## 2022-09-09 ENCOUNTER — Ambulatory Visit (HOSPITAL_COMMUNITY)
Admission: RE | Admit: 2022-09-09 | Discharge: 2022-09-09 | Disposition: A | Discharge status: Home / Self Care | Payer: Medicare Other | Source: Ambulatory Visit | Attending: Cardiology | Admitting: Cardiology

## 2022-09-09 ENCOUNTER — Ambulatory Visit (HOSPITAL_COMMUNITY): Payer: Medicare Other

## 2022-09-09 DIAGNOSIS — R072 Precordial pain: Secondary | ICD-10-CM

## 2022-11-13 DIAGNOSIS — L812 Freckles: Secondary | ICD-10-CM | POA: Diagnosis not present

## 2022-11-13 DIAGNOSIS — L821 Other seborrheic keratosis: Secondary | ICD-10-CM | POA: Diagnosis not present

## 2022-11-13 DIAGNOSIS — L570 Actinic keratosis: Secondary | ICD-10-CM | POA: Diagnosis not present

## 2022-11-13 DIAGNOSIS — D225 Melanocytic nevi of trunk: Secondary | ICD-10-CM | POA: Diagnosis not present

## 2022-11-13 DIAGNOSIS — L84 Corns and callosities: Secondary | ICD-10-CM | POA: Diagnosis not present

## 2023-01-09 DIAGNOSIS — K080 Exfoliation of teeth due to systemic causes: Secondary | ICD-10-CM | POA: Diagnosis not present

## 2023-01-21 DIAGNOSIS — Z0121 Encounter for dental examination and cleaning with abnormal findings: Secondary | ICD-10-CM | POA: Diagnosis not present

## 2023-01-30 DIAGNOSIS — Z0121 Encounter for dental examination and cleaning with abnormal findings: Secondary | ICD-10-CM | POA: Diagnosis not present

## 2023-02-19 DIAGNOSIS — Z0121 Encounter for dental examination and cleaning with abnormal findings: Secondary | ICD-10-CM | POA: Diagnosis not present

## 2023-03-04 DIAGNOSIS — Z0121 Encounter for dental examination and cleaning with abnormal findings: Secondary | ICD-10-CM | POA: Diagnosis not present

## 2023-04-15 DIAGNOSIS — Z0121 Encounter for dental examination and cleaning with abnormal findings: Secondary | ICD-10-CM | POA: Diagnosis not present

## 2023-06-05 ENCOUNTER — Ambulatory Visit: Payer: Medicare Other

## 2023-06-05 VITALS — Ht 71.0 in | Wt 166.0 lb

## 2023-06-05 DIAGNOSIS — Z0000 Encounter for general adult medical examination without abnormal findings: Secondary | ICD-10-CM | POA: Diagnosis not present

## 2023-06-05 NOTE — Progress Notes (Signed)
Subjective:   Matthew Vasquez is a 73 y.o. male who presents for Medicare Annual/Subsequent preventive examination.  Visit Complete: Virtual I connected with  Matthew Vasquez on 06/05/23 by a audio enabled telemedicine application and verified that I am speaking with the correct person using two identifiers.  Patient Location: Home  Provider Location: Home Office  I discussed the limitations of evaluation and management by telemedicine. The patient expressed understanding and agreed to proceed.  Vital Signs: Because this visit was a virtual/telehealth visit, some criteria may be missing or patient reported. Any vitals not documented were not able to be obtained and vitals that have been documented are patient reported.  Patient Medicare AWV questionnaire was completed by the patient on 06/01/23; I have confirmed that all information answered by patient is correct and no changes since this date.  Cardiac Risk Factors include: advanced age (>41men, >59 women);male gender;dyslipidemia     Objective:    Today's Vitals   06/05/23 1207  Weight: 166 lb (75.3 kg)  Height: 5\' 11"  (1.803 m)   Body mass index is 23.15 kg/m.     06/05/2023   12:37 PM 06/03/2022    8:56 AM 03/08/2021   11:25 AM 01/29/2018   10:46 AM 01/27/2017    9:40 AM  Advanced Directives  Does Patient Have a Medical Advance Directive? Yes Yes Yes Yes Yes  Type of Estate agent of [place];Living will Healthcare Power of [place];Living will Healthcare Power of [place];Living will Living will;Healthcare Power of [employer] Power of [place];Living will  Does patient want to make changes to medical advance directive? No - Patient declined   No - Patient declined No - Patient declined  Copy of Healthcare Power of Attorney in Chart? Yes - validated most recent copy scanned in chart (See row information) No - copy requested No - copy requested No - copy requested No - copy requested     Current Medications (verified) Outpatient Encounter Medications as of 06/05/2023  Medication Sig   cholecalciferol (VITAMIN D) 1000 UNITS tablet Take 1,000 Units by mouth daily.   Dextromethorphan-guaiFENesin (MUCINEX DM PO) Take 1,200 mg by mouth at bedtime.   loratadine (CLARITIN) 10 MG tablet Take 10 mg by mouth daily.   omeprazole (PRILOSEC) 20 MG capsule Take 20 mg by mouth daily.   tadalafil (CIALIS) 10 MG tablet Take 1-2 tablets (10-20 mg total) by mouth daily as needed for erectile dysfunction.   [DISCONTINUED] amoxicillin (AMOXIL) 875 MG tablet Take 875 mg by mouth 2 (two) times daily.   No facility-administered encounter medications on file as of 06/05/2023.    Allergies (verified) Patient has no known allergies.   History: Past Medical History:  Diagnosis Date   Allergy    moderate per pt   Blood transfusion without reported diagnosis    age 19 yo    BPH (benign prostatic hyperplasia)    History of rectal bleeding    Hyperlipidemia    Past Surgical History:  Procedure Laterality Date   COLONOSCOPY     TONSILLECTOMY     Family History  Problem Relation Age of Onset   Heart disease Mother        aortic valve   Cancer Father        prostate and bone   Prostate cancer Father    Bone cancer Father    Heart disease Sister        no details   Epilepsy Brother    Colon cancer Neg  Hx    Colon polyps Neg Hx    Esophageal cancer Neg Hx    Rectal cancer Neg Hx    Stomach cancer Neg Hx    Social History   Socioeconomic History   Marital status: Married    Spouse name: Not on file   Number of children: Not on file   Years of education: Not on file   Highest education level: Not on file  Occupational History   Occupation: retired  Tobacco Use   Smoking status: Never   Smokeless tobacco: Never  Vaping Use   Vaping status: Never Used  Substance and Sexual Activity   Alcohol use: Yes    Alcohol/week: 2.0 standard drinks of alcohol    Types: 1 Glasses of  wine, 1 Standard drinks or equivalent per week   Drug use: No   Sexual activity: Yes  Other Topics Concern   Not on file  Social History Narrative   Lives wife.  Retired Equities trader     Social Drivers of Corporate investment banker Strain: Low Risk  (06/05/2023)   Overall Financial Resource Strain (CARDIA)    Difficulty of Paying Living Expenses: Not hard at all  Food Insecurity: No Food Insecurity (06/05/2023)   Hunger Vital Sign    Worried About Running Out of Food in the Last Year: Never true    Ran Out of Food in the Last Year: Never true  Transportation Needs: No Transportation Needs (06/05/2023)   PRAPARE - Administrator, Civil Service (Medical): No    Lack of Transportation (Non-Medical): No  Physical Activity: Sufficiently Active (06/05/2023)   Exercise Vital Sign    Days of Exercise per Week: 5 days    Minutes of Exercise per Session: 40 min  Stress: No Stress Concern Present (06/05/2023)   [employer] of Occupational Health - Occupational Stress Questionnaire    Feeling of Stress : Not at all  Social Connections: Socially Integrated (06/05/2023)   Social Connection and Isolation Panel [NHANES]    Frequency of Communication with Friends and Family: More than three times a week    Frequency of Social Gatherings with Friends and Family: Three times a week    Attends Religious Services: More than 4 times per year    Active Member of Clubs or Organizations: Yes    Attends Engineer, structural: More than 4 times per year    Marital Status: Married    Tobacco Counseling Counseling given: Not Answered   Clinical Intake:  Pre-visit preparation completed: Yes  Pain : No/denies pain  Diabetes: No  How often do you need to have someone help you when you read instructions, pamphlets, or other written materials from your doctor or pharmacy?: 1 - Never  Interpreter Needed?: No  Information entered by :: Kandis Fantasia LPN   Activities of Daily  Living    06/01/2023    9:15 AM  In your present state of health, do you have any difficulty performing the following activities:  Hearing? 0  Vision? 0  Difficulty concentrating or making decisions? 0  Walking or climbing stairs? 0  Dressing or bathing? 0  Doing errands, shopping? 0  Preparing Food and eating ? N  Using the Toilet? N  In the past six months, have you accidently leaked urine? N  Do you have problems with loss of bowel control? N  Managing your Medications? N  Managing your Finances? N  Housekeeping or managing your Housekeeping? N  Patient Care Team: Dettinger, Elige Radon, MD as PCP - General (Family Medicine) Janet Berlin, MD as Consulting Physician (Ophthalmology)  Indicate any recent Medical Services you may have received from other than [facility] providers in the past year (date may be approximate).     Assessment:   This is a routine wellness examination for [facility].  Hearing/Vision screen Hearing Screening - Comments:: Denies hearing difficulties   Vision Screening - Comments:: Wears rx glasses - up to date with routine eye exams with Dr. Burgess Estelle     Goals Addressed             This Visit's Progress    Remain active and independent        Depression Screen    06/05/2023   12:08 PM 06/14/2022    2:47 PM 06/03/2022    8:55 AM 04/18/2021   10:02 AM 03/08/2021   11:37 AM 03/21/2020   10:03 AM 01/11/2019   10:05 AM  PHQ 2/9 Scores  PHQ - 2 Score 0 0 0 0 0 0 0  PHQ- 9 Score  0         Fall Risk    06/05/2023   12:37 PM 06/01/2023    9:15 AM 06/14/2022    2:47 PM 06/03/2022    8:53 AM 04/18/2021   10:02 AM  Fall Risk   Falls in the past year? 0 0 0 0 0  Number falls in past yr: 0   0   Injury with Fall? 0   0   Risk for fall due to : No Fall Risks   No Fall Risks   Follow up Falls prevention discussed;Education provided;Falls evaluation completed   Falls prevention discussed     MEDICARE RISK AT HOME: Medicare Risk at Home Any stairs in or  around the home?: (Patient-Rptd) Yes If so, are there any without handrails?: (Patient-Rptd) Yes Home free of loose throw rugs in walkways, pet beds, electrical cords, etc?: (Patient-Rptd) Yes Adequate lighting in your home to reduce risk of falls?: (Patient-Rptd) Yes Life alert?: (Patient-Rptd) No Use of a cane, walker or w/c?: (Patient-Rptd) No Grab bars in the bathroom?: (Patient-Rptd) Yes Shower chair or bench in shower?: (Patient-Rptd) Yes Elevated toilet seat or a handicapped toilet?: (Patient-Rptd) No  TIMED UP AND GO:  Was the test performed?  No    Cognitive Function:    01/29/2018   10:54 AM 01/27/2017    9:45 AM  MMSE - Mini Mental State Exam  Orientation to time 5 5  Orientation to Place 5 5  Registration 3 3  Attention/ Calculation 5 5  Recall 3 2  Language- name 2 objects 2 2  Language- repeat 1 1  Language- follow 3 step command 3 3  Language- read & follow direction 1 1  Write a sentence 1 1  Copy design 1 1  Total score 30 29        06/05/2023   12:25 PM 06/03/2022    8:57 AM  6CIT Screen  What Year? 0 points 0 points  What month? 0 points 0 points  What time? 0 points 0 points  Count back from 20 0 points 0 points  Months in reverse 0 points 0 points  Repeat phrase 0 points 0 points  Total Score 0 points 0 points    Immunizations Immunization History  Administered Date(s) Administered   Fluad Quad(high Dose 65+) 02/27/2021, 02/19/2022, 03/28/2023   Influenza,inj,Quad PF,6+ Mos 03/25/2013, 03/22/2014, 02/23/2015   Influenza-Unspecified 02/22/2016, 02/10/2017,  02/10/2018, 01/29/2019, 02/17/2020   Moderna Covid-19 Fall Seasonal Vaccine 71yrs & older 03/14/2023   Moderna SARS-COV2 Booster Vaccination 02/27/2021   Moderna Sars-Covid-2 Vaccination 09/05/2020   PFIZER(Purple Top)SARS-COV-2 Vaccination 06/02/2019, 06/21/2019, 02/17/2020   Pfizer(Comirnaty)Fall Seasonal Vaccine 12 years and older 03/07/2022   Pneumococcal Conjugate-13 09/27/2014    Pneumococcal Polysaccharide-23 11/07/2016   Tdap 02/26/2010, 03/21/2020   Zoster Recombinant(Shingrix) 12/01/2017, 02/10/2018    TDAP status: Up to date  Flu Vaccine status: Up to date  Pneumococcal vaccine status: Up to date  Covid-19 vaccine status: Information provided on how to obtain vaccines.   Qualifies for Shingles Vaccine? Yes   Zostavax completed No   Shingrix Completed?: Yes  Screening Tests Health Maintenance  Topic Date Due   Medicare Annual Wellness (AWV)  06/04/2024   Colonoscopy  07/19/2026   DTaP/Tdap/Td (3 - Td or Tdap) 03/21/2030   Pneumonia Vaccine 19+ Years old  Completed   INFLUENZA VACCINE  Completed   COVID-19 Vaccine  Completed   Hepatitis C Screening  Completed   Zoster Vaccines- Shingrix  Completed   HPV VACCINES  Aged Out    Health Maintenance  There are no preventive care reminders to display for this patient.   Colorectal cancer screening: Type of screening: Colonoscopy. Completed 07/19/19. Repeat every 7 years  Lung Cancer Screening: (Low Dose CT Chest recommended if Age 25-80 years, 20 pack-year currently smoking OR have quit w/in 15years.) does not qualify.   Lung Cancer Screening Referral: n/a  Additional Screening:  Hepatitis C Screening: does qualify; Completed 11/04/16  Vision Screening: Recommended annual ophthalmology exams for early detection of glaucoma and other disorders of the eye. Is the patient up to date with their annual eye exam?  Yes  Who is the provider or what is the name of the office in which the patient attends annual eye exams? Dr. Burgess Estelle  If pt is not established with a provider, would they like to be referred to a provider to establish care? No .   Dental Screening: Recommended annual dental exams for proper oral hygiene  Community Resource Referral / Chronic Care Management: CRR required this visit?  No   CCM required this visit?  No     Plan:     I have personally reviewed and noted the following  in the patient's chart:   Medical and social history Use of alcohol, tobacco or illicit drugs  Current medications and supplements including opioid prescriptions. Patient is not currently taking opioid prescriptions. Functional ability and status Nutritional status Physical activity Advanced directives List of other physicians Hospitalizations, surgeries, and ER visits in previous 12 months Vitals Screenings to include cognitive, depression, and falls Referrals and appointments  In addition, I have reviewed and discussed with patient certain preventive protocols, quality metrics, and best practice recommendations. A written personalized care plan for preventive services as well as general preventive health recommendations were provided to patient.     Kandis Fantasia [place], [place]   1/61/0960   After Visit Summary: (MyChart) Due to this being a telephonic visit, the after visit summary with patients personalized plan was offered to patient via MyChart   Nurse Notes: No concerns at this time

## 2023-06-05 NOTE — Patient Instructions (Signed)
Matthew Vasquez , Thank you for taking time to come for your Medicare Wellness Visit. I appreciate your ongoing commitment to your health goals. Please review the following plan we discussed and let me know if I can assist you in the future.   Referrals/Orders/Follow-Ups/Clinician Recommendations: Aim for 30 minutes of exercise or brisk walking, 6-8 glasses of water, and 5 servings of fruits and vegetables each day.  This is a list of the screening recommended for you and due dates:  Health Maintenance  Topic Date Due   Medicare Annual Wellness Visit  06/04/2024   Colon Cancer Screening  07/19/2026   DTaP/Tdap/Td vaccine (3 - Td or Tdap) 03/21/2030   Pneumonia Vaccine  Completed   Flu Shot  Completed   COVID-19 Vaccine  Completed   Hepatitis C Screening  Completed   Zoster (Shingles) Vaccine  Completed   HPV Vaccine  Aged Out    Advanced directives: (In Chart) A copy of your advanced directives are scanned into your chart should your provider ever need it.  Next Medicare Annual Wellness Visit scheduled for next year: Yes

## 2023-06-11 DIAGNOSIS — Z0121 Encounter for dental examination and cleaning with abnormal findings: Secondary | ICD-10-CM | POA: Diagnosis not present

## 2023-06-13 DIAGNOSIS — H5203 Hypermetropia, bilateral: Secondary | ICD-10-CM | POA: Diagnosis not present

## 2023-06-23 ENCOUNTER — Other Ambulatory Visit: Payer: Medicare Other

## 2023-06-23 DIAGNOSIS — Z0000 Encounter for general adult medical examination without abnormal findings: Secondary | ICD-10-CM

## 2023-06-23 DIAGNOSIS — E7800 Pure hypercholesterolemia, unspecified: Secondary | ICD-10-CM | POA: Diagnosis not present

## 2023-06-23 DIAGNOSIS — N400 Enlarged prostate without lower urinary tract symptoms: Secondary | ICD-10-CM | POA: Diagnosis not present

## 2023-06-23 LAB — LIPID PANEL

## 2023-06-24 LAB — CMP14+EGFR
ALT: 22 [IU]/L (ref 0–44)
AST: 22 [IU]/L (ref 0–40)
Albumin: 4.2 g/dL (ref 3.8–4.8)
Alkaline Phosphatase: 88 [IU]/L (ref 44–121)
BUN/Creatinine Ratio: 29 — ABNORMAL HIGH (ref 10–24)
BUN: 23 mg/dL (ref 8–27)
Bilirubin Total: <0.2 mg/dL (ref 0.0–1.2)
CO2: 25 mmol/L (ref 20–29)
Calcium: 9 mg/dL (ref 8.6–10.2)
Chloride: 107 mmol/L — ABNORMAL HIGH (ref 96–106)
Creatinine, Ser: 0.78 mg/dL (ref 0.76–1.27)
Globulin, Total: 2.4 g/dL (ref 1.5–4.5)
Glucose: 99 mg/dL (ref 70–99)
Potassium: 4.5 mmol/L (ref 3.5–5.2)
Sodium: 144 mmol/L (ref 134–144)
Total Protein: 6.6 g/dL (ref 6.0–8.5)
eGFR: 94 mL/min/{1.73_m2} (ref >59)

## 2023-06-24 LAB — CBC WITH DIFFERENTIAL/PLATELET
Basophils Absolute: 0.1 10*3/uL (ref 0.0–0.2)
Basos: 1 %
EOS (ABSOLUTE): 0.2 10*3/uL (ref 0.0–0.4)
Eos: 4 %
Hematocrit: 41.1 % (ref 37.5–51.0)
Hemoglobin: 14 g/dL (ref 13.0–17.7)
Immature Grans (Abs): 0 10*3/uL (ref 0.0–0.1)
Immature Granulocytes: 0 %
Lymphocytes Absolute: 2.1 10*3/uL (ref 0.7–3.1)
Lymphs: 36 %
MCH: 32.3 pg (ref 26.6–33.0)
MCHC: 34.1 g/dL (ref 31.5–35.7)
MCV: 95 fL (ref 79–97)
Monocytes Absolute: 0.6 10*3/uL (ref 0.1–0.9)
Monocytes: 10 %
Neutrophils Absolute: 3 10*3/uL (ref 1.4–7.0)
Neutrophils: 49 %
Platelets: 220 10*3/uL (ref 150–450)
RBC: 4.33 x10E6/uL (ref 4.14–5.80)
RDW: 11.8 % (ref 11.6–15.4)
WBC: 6 10*3/uL (ref 3.4–10.8)

## 2023-06-24 LAB — LIPID PANEL
Cholesterol, Total: 168 mg/dL (ref 100–199)
HDL: 58 mg/dL (ref >39)
LDL CALC COMMENT:: 2.9 ratio (ref 0.0–5.0)
LDL Chol Calc (NIH): 100 mg/dL — ABNORMAL HIGH (ref 0–99)
Triglycerides: 47 mg/dL (ref 0–149)
VLDL Cholesterol Cal: 10 mg/dL (ref 5–40)

## 2023-06-24 LAB — VITAMIN D 25 HYDROXY (VIT D DEFICIENCY, FRACTURES): Vit D, 25-Hydroxy: 47.6 ng/mL (ref 30.0–100.0)

## 2023-06-24 LAB — PSA, TOTAL AND FREE
PSA, Free Pct: 18.8 %
PSA, Free: 0.45 ng/mL
Prostate Specific Ag, Serum: 2.4 ng/mL (ref 0.0–4.0)

## 2023-06-26 ENCOUNTER — Ambulatory Visit: Payer: Medicare Other | Admitting: Family Medicine

## 2023-06-26 ENCOUNTER — Encounter: Payer: Self-pay | Admitting: Family Medicine

## 2023-06-26 ENCOUNTER — Ambulatory Visit (INDEPENDENT_AMBULATORY_CARE_PROVIDER_SITE_OTHER): Payer: Medicare Other | Admitting: Family Medicine

## 2023-06-26 VITALS — BP 93/63 | HR 73 | Temp 98.0°F | Wt 159.8 lb

## 2023-06-26 DIAGNOSIS — E7800 Pure hypercholesterolemia, unspecified: Secondary | ICD-10-CM

## 2023-06-26 DIAGNOSIS — Z0000 Encounter for general adult medical examination without abnormal findings: Secondary | ICD-10-CM | POA: Diagnosis not present

## 2023-06-26 DIAGNOSIS — N5201 Erectile dysfunction due to arterial insufficiency: Secondary | ICD-10-CM

## 2023-06-26 DIAGNOSIS — N400 Enlarged prostate without lower urinary tract symptoms: Secondary | ICD-10-CM

## 2023-06-26 MED ORDER — TADALAFIL 10 MG PO TABS: 10.0000 mg | ORAL_TABLET | Freq: Every day | ORAL | 3 refills | Status: AC | PRN

## 2023-06-26 NOTE — Progress Notes (Addendum)
BP 93/63   Pulse 73   Temp 98 F (36.7 C)   Wt 159 lb 12.8 oz (72.5 kg)   SpO2 96%   BMI 22.29 kg/m    Subjective:   Patient ID: Matthew Vasquez, male    DOB: 04/12/1951, 73 y.o.   MRN: [id number]  HPI: Matthew Vasquez is a 73 y.o. male presenting on 06/26/2023 for Medical Management of Chronic Issues (CPE)   HPI Physical exam Patient denies any chest pain, shortness of breath, headaches or vision issues, abdominal complaints, diarrhea, nausea, vomiting, or joint issues.  He wants a refill on his erectile dysfunction medication otherwise he is doing very well denies any issues.  Patient denies any urinary issues associated with his prostate.  Relevant past medical, surgical, family and social history reviewed and updated as indicated. Interim medical history since our last visit reviewed. Allergies and medications reviewed and updated.  Review of Systems  Constitutional:  Negative for chills and fever.  HENT:  Negative for ear pain and tinnitus.   Eyes:  Negative for pain.  Respiratory:  Negative for cough, shortness of breath and wheezing.   Cardiovascular:  Negative for chest pain, palpitations and leg swelling.  Gastrointestinal:  Negative for abdominal pain, blood in stool, constipation and diarrhea.  Genitourinary:  Negative for dysuria and hematuria.  Musculoskeletal:  Negative for back pain and myalgias.  Skin:  Negative for rash.  Neurological:  Negative for dizziness, weakness and headaches.  Psychiatric/Behavioral:  Negative for suicidal ideas.     Per HPI unless specifically indicated above   Allergies as of 06/26/2023   No Known Allergies      Medication List        Accurate as of June 26, 2023  3:52 PM. If you have any questions, ask your nurse or doctor.          cholecalciferol 1000 units tablet Commonly known as: VITAMIN D Take 1,000 Units by mouth daily.   loratadine 10 MG tablet Commonly known as: CLARITIN Take 10 mg by mouth  daily.   MUCINEX DM PO Take 1,200 mg by mouth at bedtime.   omeprazole 20 MG capsule Commonly known as: PRILOSEC Take 20 mg by mouth daily.   tadalafil 10 MG tablet Commonly known as: Cialis Take 1-2 tablets (10-20 mg total) by mouth daily as needed for erectile dysfunction.         Objective:   BP 93/63   Pulse 73   Temp 98 F (36.7 C)   Wt 159 lb 12.8 oz (72.5 kg)   SpO2 96%   BMI 22.29 kg/m   Wt Readings from Last 3 Encounters:  06/26/23 159 lb 12.8 oz (72.5 kg)  06/05/23 166 lb (75.3 kg)  09/06/22 166 lb (75.3 kg)    Physical Exam Vitals reviewed.  Constitutional:      General: He is not in acute distress.    Appearance: He is well-developed. He is not diaphoretic.  HENT:     Right Ear: External ear normal.     Left Ear: External ear normal.     Nose: Nose normal.     Mouth/Throat:     Pharynx: No oropharyngeal exudate.  Eyes:     General: No scleral icterus.       Right eye: No discharge.     Conjunctiva/sclera: Conjunctivae normal.     Pupils: Pupils are equal, round, and reactive to light.  Neck:     Thyroid: No thyromegaly.  Cardiovascular:  Rate and Rhythm: Normal rate and regular rhythm.     Heart sounds: Normal heart sounds. No murmur heard. Pulmonary:     Effort: Pulmonary effort is normal. No respiratory distress.     Breath sounds: Normal breath sounds. No wheezing.  Abdominal:     General: Bowel sounds are normal. There is no distension.     Palpations: Abdomen is soft.     Tenderness: There is no abdominal tenderness. There is no guarding or rebound.  Musculoskeletal:        General: No swelling. Normal range of motion.     Cervical back: Neck supple.  Lymphadenopathy:     Cervical: No cervical adenopathy.  Skin:    General: Skin is warm and dry.     Findings: No rash.  Neurological:     Mental Status: He is alert and oriented to person, place, and time.     Coordination: Coordination normal.  Psychiatric:        Behavior:  Behavior normal.     Results for orders placed or performed in visit on 06/23/23  CBC with Differential/Platelet   Collection Time: 06/23/23  8:57 AM  Result Value Ref Range   WBC 6.0 3.4 - 10.8 x10E3/uL   RBC 4.33 4.14 - 5.80 x10E6/uL   Hemoglobin 14.0 13.0 - 17.7 g/dL   Hematocrit 16.1 09.6 - 51.0 %   MCV 95 79 - 97 fL   MCH 32.3 26.6 - 33.0 pg   MCHC 34.1 31.5 - 35.7 g/dL   RDW 04.5 40.9 - 81.1 %   Platelets 220 150 - 450 x10E3/uL   Neutrophils 49 Not Estab. %   Lymphs 36 Not Estab. %   Monocytes 10 Not Estab. %   Eos 4 Not Estab. %   Basos 1 Not Estab. %   Neutrophils Absolute 3.0 1.4 - 7.0 x10E3/uL   Lymphocytes Absolute 2.1 0.7 - 3.1 x10E3/uL   Monocytes Absolute 0.6 0.1 - 0.9 x10E3/uL   EOS (ABSOLUTE) 0.2 0.0 - 0.4 x10E3/uL   Basophils Absolute 0.1 0.0 - 0.2 x10E3/uL   Immature Granulocytes 0 Not Estab. %   Immature Grans (Abs) 0.0 0.0 - 0.1 x10E3/uL  CMP14+EGFR   Collection Time: 06/23/23  8:57 AM  Result Value Ref Range   Glucose 99 70 - 99 mg/dL   BUN 23 8 - 27 mg/dL   Creatinine, Ser 9.14 0.76 - 1.27 mg/dL   eGFR 94 >78 GN/FAO/1.30   BUN/Creatinine Ratio 29 (H) 10 - 24   Sodium 144 134 - 144 mmol/L   Potassium 4.5 3.5 - 5.2 mmol/L   Chloride 107 (H) 96 - 106 mmol/L   CO2 25 20 - 29 mmol/L   Calcium 9.0 8.6 - 10.2 mg/dL   Total Protein 6.6 6.0 - 8.5 g/dL   Albumin 4.2 3.8 - 4.8 g/dL   Globulin, Total 2.4 1.5 - 4.5 g/dL   Bilirubin Total <8.6 0.0 - 1.2 mg/dL   Alkaline Phosphatase 88 44 - 121 IU/L   AST 22 0 - 40 IU/L   ALT 22 0 - 44 IU/L  Lipid panel   Collection Time: 06/23/23  8:57 AM  Result Value Ref Range   Cholesterol, Total 168 100 - 199 mg/dL   Triglycerides 47 0 - 149 mg/dL   HDL 58 >57 mg/dL   VLDL Cholesterol Cal 10 5 - 40 mg/dL   LDL Chol Calc (NIH) 846 (H) 0 - 99 mg/dL   Chol/HDL Ratio 2.9 0.0 - 5.0 ratio  VITAMIN D 25 Hydroxy (Vit-D Deficiency, Fractures)   Collection Time: 06/23/23  8:57 AM  Result Value Ref Range   Vit D,  25-Hydroxy 47.6 30.0 - 100.0 ng/mL  PSA, total and free   Collection Time: 06/23/23  8:57 AM  Result Value Ref Range   Prostate Specific Ag, Serum 2.4 0.0 - 4.0 ng/mL   PSA, Free 0.45 N/A ng/mL   PSA, Free Pct 18.8 %    Assessment & Plan:   Problem List Items Addressed This Visit       Genitourinary   BPH (benign prostatic hyperplasia)     Other   Hyperlipidemia   Relevant Medications   tadalafil (CIALIS) 10 MG tablet   Erectile dysfunction   Relevant Medications   tadalafil (CIALIS) 10 MG tablet   Other Visit Diagnoses       Physical exam    -  Primary       Doing well, continue current medicine Patient's blood work looks good for the cholesterol and his PSA. Patient wants to refill Cialis Follow up plan: Return in about 1 year (around 06/25/2024), or if symptoms worsen or fail to improve, for Physical exam.  Counseling provided for all of the vaccine components No orders of the defined types were placed in this encounter.   Arville Care, MD [facility] 06/26/2023, 3:52 PM

## 2023-07-28 DIAGNOSIS — Z0121 Encounter for dental examination and cleaning with abnormal findings: Secondary | ICD-10-CM | POA: Diagnosis not present

## 2023-10-21 DIAGNOSIS — Z0121 Encounter for dental examination and cleaning with abnormal findings: Secondary | ICD-10-CM | POA: Diagnosis not present

## 2023-11-26 DIAGNOSIS — L812 Freckles: Secondary | ICD-10-CM | POA: Diagnosis not present

## 2023-11-26 DIAGNOSIS — L821 Other seborrheic keratosis: Secondary | ICD-10-CM | POA: Diagnosis not present

## 2023-11-26 DIAGNOSIS — D1801 Hemangioma of skin and subcutaneous tissue: Secondary | ICD-10-CM | POA: Diagnosis not present

## 2023-11-26 DIAGNOSIS — L570 Actinic keratosis: Secondary | ICD-10-CM | POA: Diagnosis not present

## 2024-01-06 DIAGNOSIS — Z0121 Encounter for dental examination and cleaning with abnormal findings: Secondary | ICD-10-CM | POA: Diagnosis not present

## 2024-02-10 DIAGNOSIS — Z0121 Encounter for dental examination and cleaning with abnormal findings: Secondary | ICD-10-CM | POA: Diagnosis not present

## 2024-03-24 DIAGNOSIS — K080 Exfoliation of teeth due to systemic causes: Secondary | ICD-10-CM | POA: Diagnosis not present

## 2024-06-21 ENCOUNTER — Ambulatory Visit
